# Patient Record
Sex: Female | Born: 1975 | Race: Black or African American | Hispanic: No | Marital: Married | Smoking: Never smoker
Health system: Southern US, Community
[De-identification: ages and names within clinical notes are randomized; demographics above are authoritative.]

## PROBLEM LIST (undated history)

## (undated) DIAGNOSIS — I10 Essential (primary) hypertension: Secondary | ICD-10-CM

## (undated) DIAGNOSIS — Z8759 Personal history of other complications of pregnancy, childbirth and the puerperium: Secondary | ICD-10-CM

## (undated) DIAGNOSIS — E785 Hyperlipidemia, unspecified: Secondary | ICD-10-CM

## (undated) HISTORY — DX: Essential (primary) hypertension: I10

## (undated) HISTORY — DX: Personal history of other complications of pregnancy, childbirth and the puerperium: Z87.59

---

## 1999-11-10 ENCOUNTER — Other Ambulatory Visit: Admission: RE | Admit: 1999-11-10 | Discharge: 1999-11-10 | Payer: Self-pay | Admitting: *Deleted

## 1999-11-10 ENCOUNTER — Other Ambulatory Visit: Admission: RE | Admit: 1999-11-10 | Discharge: 1999-11-10 | Payer: Self-pay | Admitting: Obstetrics & Gynecology

## 2000-02-22 ENCOUNTER — Inpatient Hospital Stay (HOSPITAL_COMMUNITY): Admission: AD | Admit: 2000-02-22 | Discharge: 2000-02-22 | Payer: Self-pay | Admitting: Obstetrics and Gynecology

## 2000-04-23 ENCOUNTER — Encounter (INDEPENDENT_AMBULATORY_CARE_PROVIDER_SITE_OTHER): Payer: Self-pay

## 2000-04-23 ENCOUNTER — Encounter: Payer: Self-pay | Admitting: Obstetrics and Gynecology

## 2000-04-23 ENCOUNTER — Inpatient Hospital Stay (HOSPITAL_COMMUNITY): Admission: AD | Admit: 2000-04-23 | Discharge: 2000-04-27 | Payer: Self-pay | Admitting: Obstetrics and Gynecology

## 2000-04-28 ENCOUNTER — Encounter: Admission: RE | Admit: 2000-04-28 | Discharge: 2000-05-28 | Payer: Self-pay | Admitting: Obstetrics and Gynecology

## 2000-06-28 ENCOUNTER — Encounter: Admission: RE | Admit: 2000-06-28 | Discharge: 2000-07-28 | Payer: Self-pay | Admitting: Obstetrics and Gynecology

## 2000-07-29 ENCOUNTER — Encounter: Admission: RE | Admit: 2000-07-29 | Discharge: 2000-08-28 | Payer: Self-pay | Admitting: Obstetrics and Gynecology

## 2000-09-28 ENCOUNTER — Encounter: Admission: RE | Admit: 2000-09-28 | Discharge: 2000-10-28 | Payer: Self-pay | Admitting: Obstetrics and Gynecology

## 2000-10-29 ENCOUNTER — Encounter: Admission: RE | Admit: 2000-10-29 | Discharge: 2000-11-28 | Payer: Self-pay | Admitting: Obstetrics and Gynecology

## 2000-12-29 ENCOUNTER — Encounter: Admission: RE | Admit: 2000-12-29 | Discharge: 2001-01-28 | Payer: Self-pay | Admitting: Obstetrics and Gynecology

## 2002-10-29 ENCOUNTER — Other Ambulatory Visit: Admission: RE | Admit: 2002-10-29 | Discharge: 2002-10-29 | Payer: Self-pay | Admitting: Obstetrics and Gynecology

## 2003-05-27 ENCOUNTER — Ambulatory Visit (HOSPITAL_COMMUNITY): Admission: RE | Admit: 2003-05-27 | Discharge: 2003-05-27 | Payer: Self-pay | Admitting: Obstetrics and Gynecology

## 2003-09-01 ENCOUNTER — Ambulatory Visit (HOSPITAL_COMMUNITY): Admission: RE | Admit: 2003-09-01 | Discharge: 2003-09-01 | Payer: Self-pay | Admitting: Obstetrics and Gynecology

## 2003-10-18 ENCOUNTER — Inpatient Hospital Stay (HOSPITAL_COMMUNITY): Admission: AD | Admit: 2003-10-18 | Discharge: 2003-10-18 | Payer: Self-pay | Admitting: Obstetrics and Gynecology

## 2003-10-26 ENCOUNTER — Inpatient Hospital Stay (HOSPITAL_COMMUNITY): Admission: AD | Admit: 2003-10-26 | Discharge: 2003-10-26 | Payer: Self-pay | Admitting: Obstetrics and Gynecology

## 2003-11-06 ENCOUNTER — Inpatient Hospital Stay (HOSPITAL_COMMUNITY): Admission: AD | Admit: 2003-11-06 | Discharge: 2003-11-06 | Payer: Self-pay | Admitting: Obstetrics and Gynecology

## 2003-11-09 ENCOUNTER — Inpatient Hospital Stay (HOSPITAL_COMMUNITY): Admission: AD | Admit: 2003-11-09 | Discharge: 2003-11-09 | Payer: Self-pay | Admitting: Obstetrics and Gynecology

## 2003-11-09 ENCOUNTER — Inpatient Hospital Stay (HOSPITAL_COMMUNITY): Admission: AD | Admit: 2003-11-09 | Discharge: 2003-11-26 | Payer: Self-pay | Admitting: Obstetrics and Gynecology

## 2003-11-23 ENCOUNTER — Encounter (INDEPENDENT_AMBULATORY_CARE_PROVIDER_SITE_OTHER): Payer: Self-pay | Admitting: *Deleted

## 2004-08-19 IMAGING — US US OB TRANSVAGINAL MODIFY
1 series · 14 of 25 positions shown · non-contrast
Comparison: none

CLINICAL DATA: 27-year-old.  G2 P1 with LMP of 03/31/03.  Evaluate for gestational age.

[Series 1: unknown · 0.30mm/px · 14 of 25 slices shown]
[im 1/25]
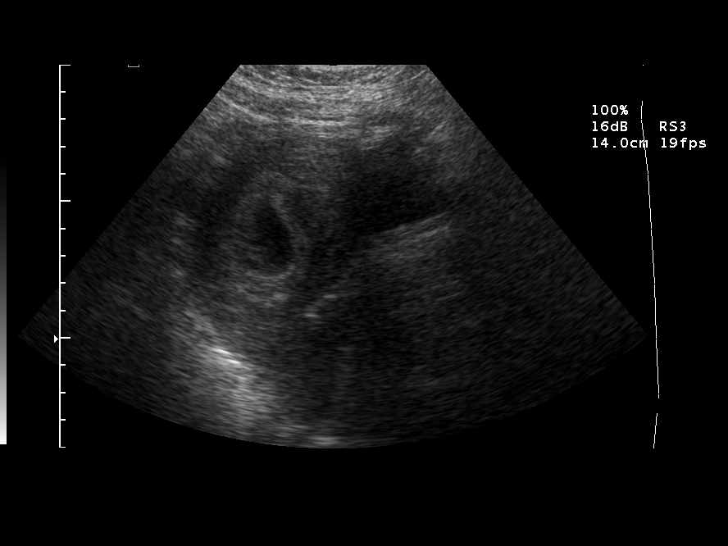
[im 3/25]
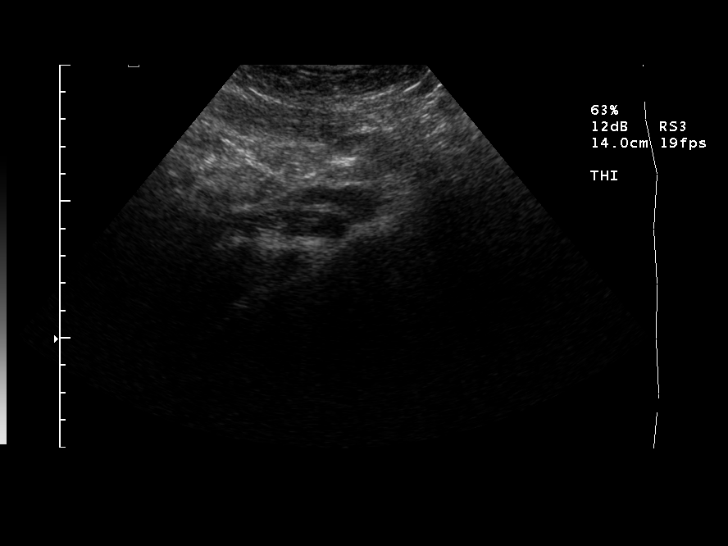
[im 5/25]
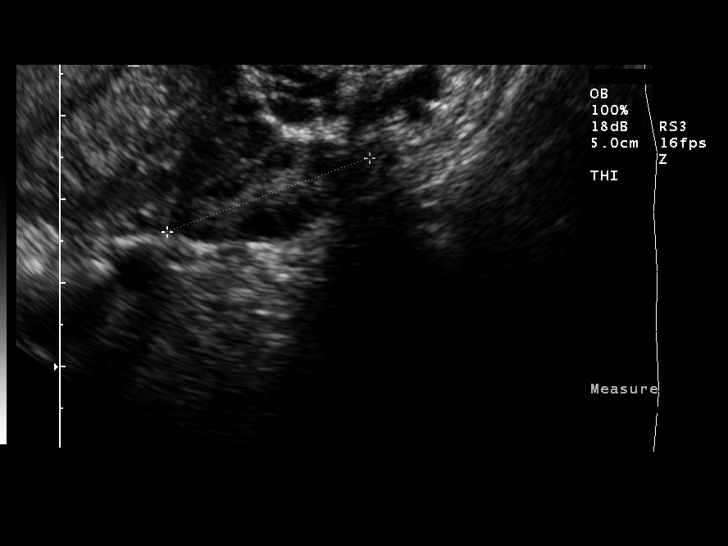
[im 7/25]
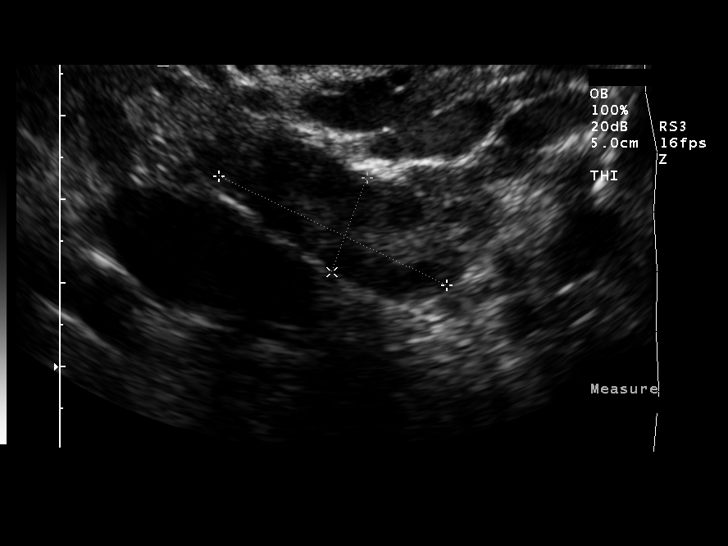
[im 9/25]
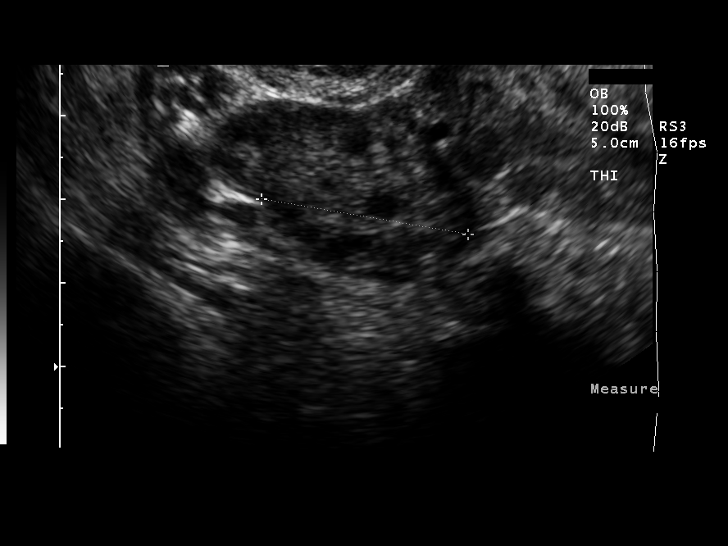
[im 10/25]
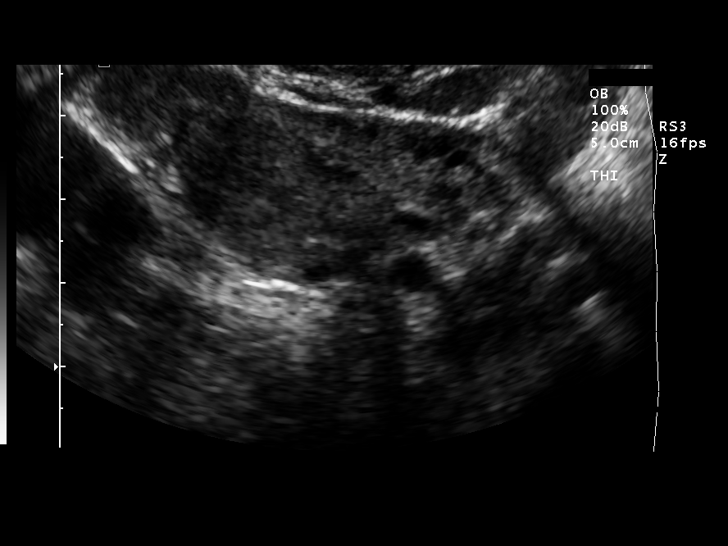
[im 12/25]
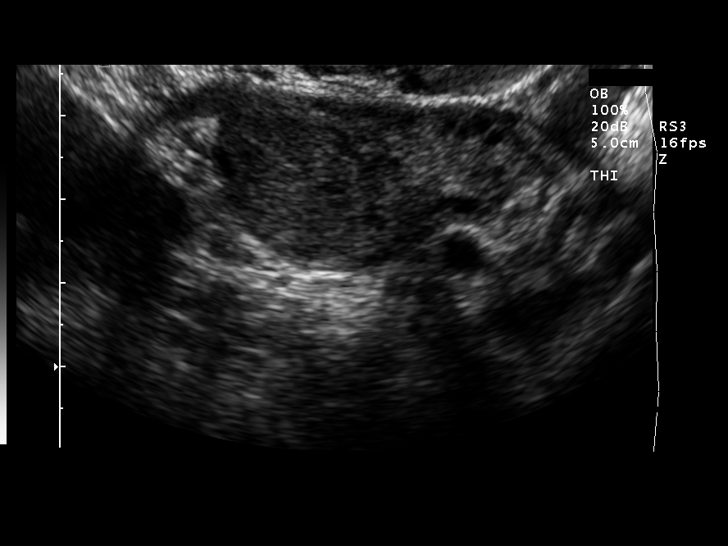
[im 14/25]
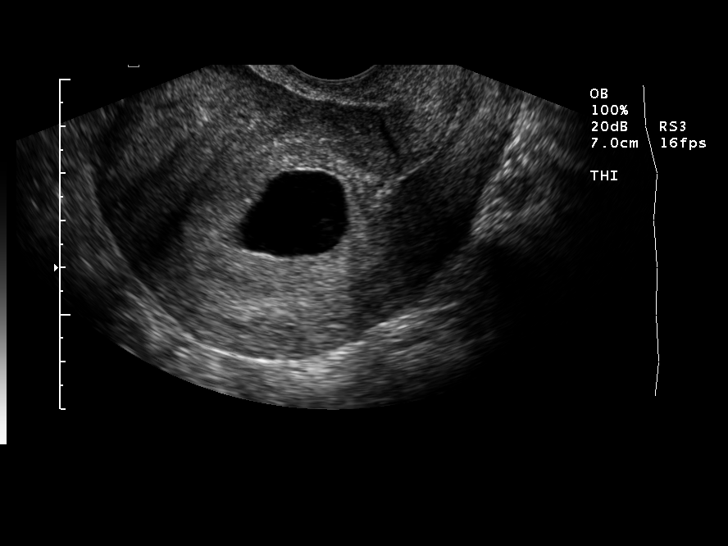
[im 16/25]
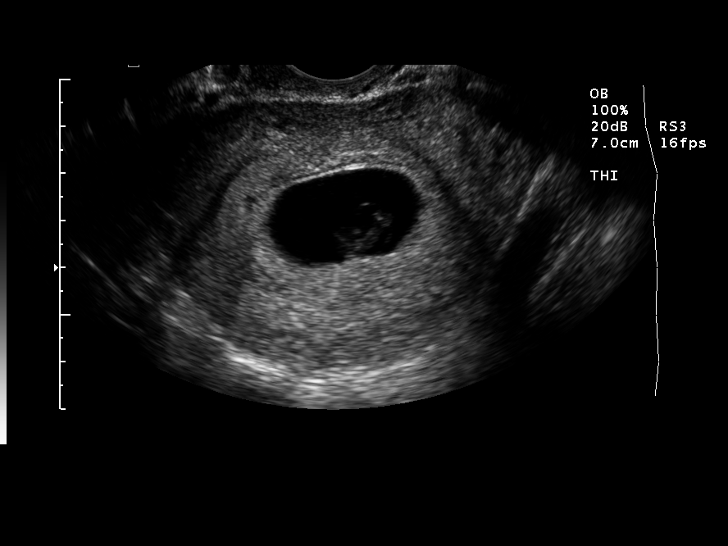
[im 17/25]
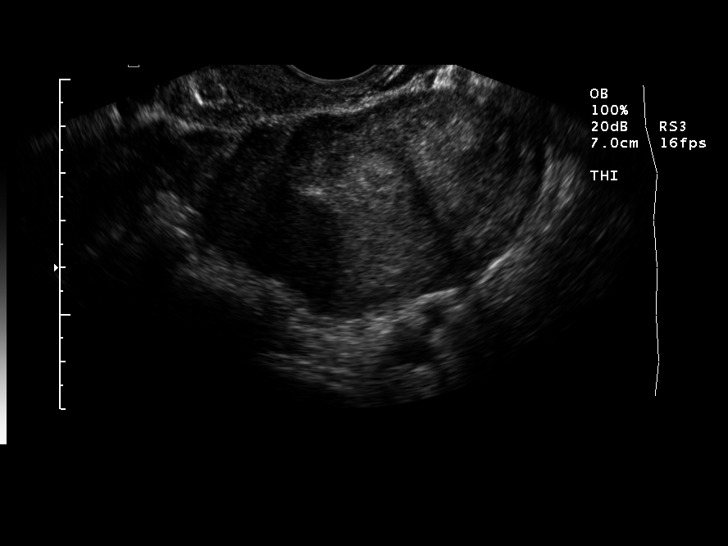
[im 19/25]
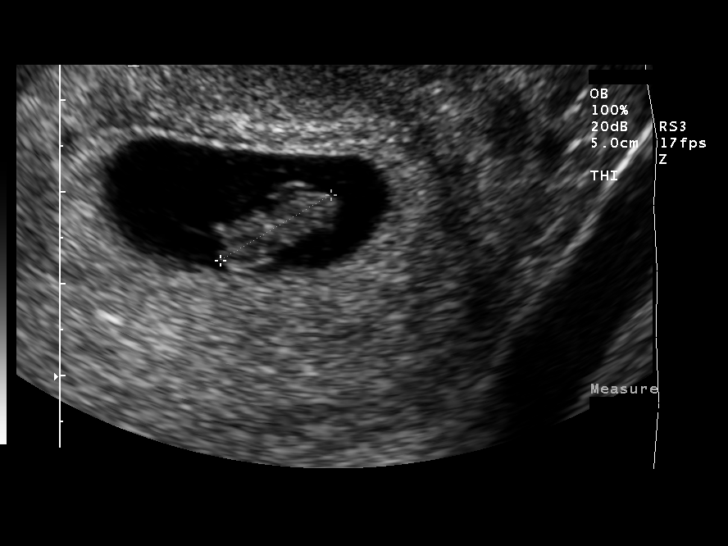
[im 21/25]
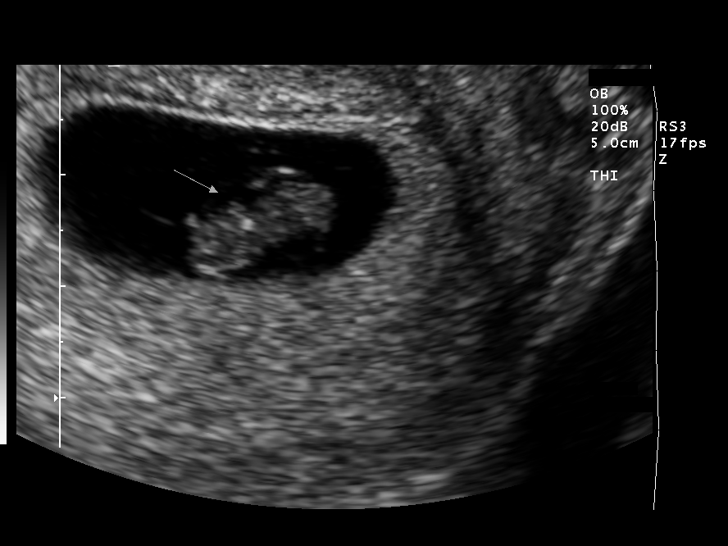
[im 23/25]
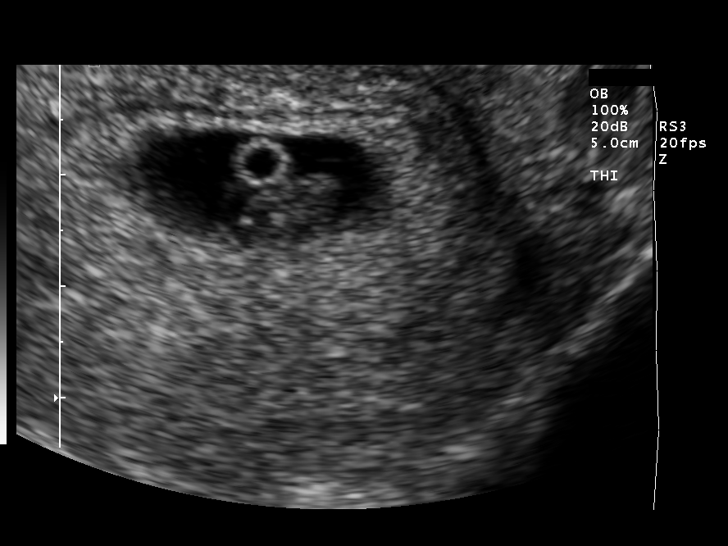
[im 25/25]
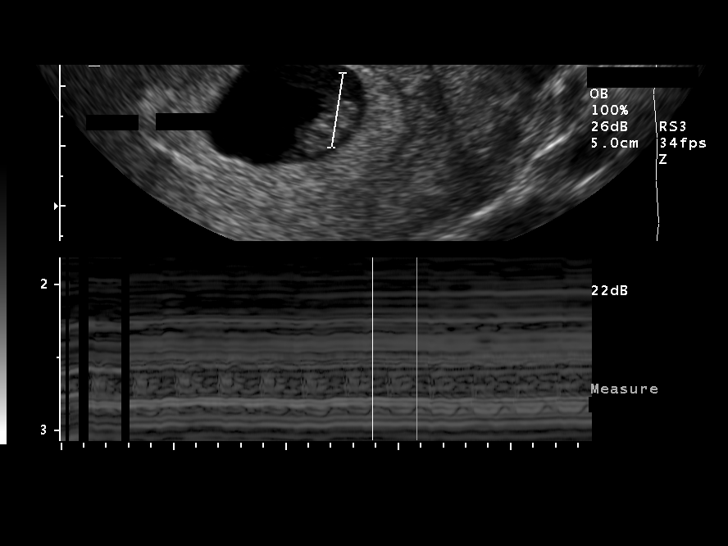

[14 of 25 positions shown; findings below may reference images not displayed]

OBSTETRICAL ULTRASOUND WITH TRANSVAGINAL

Number of Fetuses:  1
Heart Rate: 153
Presentation:  Variable 
Amniotic fluid:  Normal
CRL:   1.4 cm   7 w 5 d

Ultrasound EDC:  01/08/04

Fetal anatomy could not be evaluated due to the early gestational age.  Yolk sac and amnion are seen.

MATERNAL FINDINGS
Cervix not evaluated.  Right ovary has a corpus luteum cyst.  Left ovary is within normal limits.
IMPRESSION: Single living intrauterine fetus in variable presentation.  Size by ultrasound is 3 days behind the dating predicted by LMP.  No anomalies are seen.

## 2004-11-24 IMAGING — US US OB COMP +14 WK
1 series · 7 of 7 positions shown · non-contrast
Comparison: none

CLINICAL DATA: 22 week 0 day assigned gestational age by LMP and prior ultrasound.  Evaluate fetal anatomy and growth.  Patient was hypertensive on last exam.

[Series 1: unknown · 0.28mm/px · 7 of 7 slices shown]
[im 1/7]
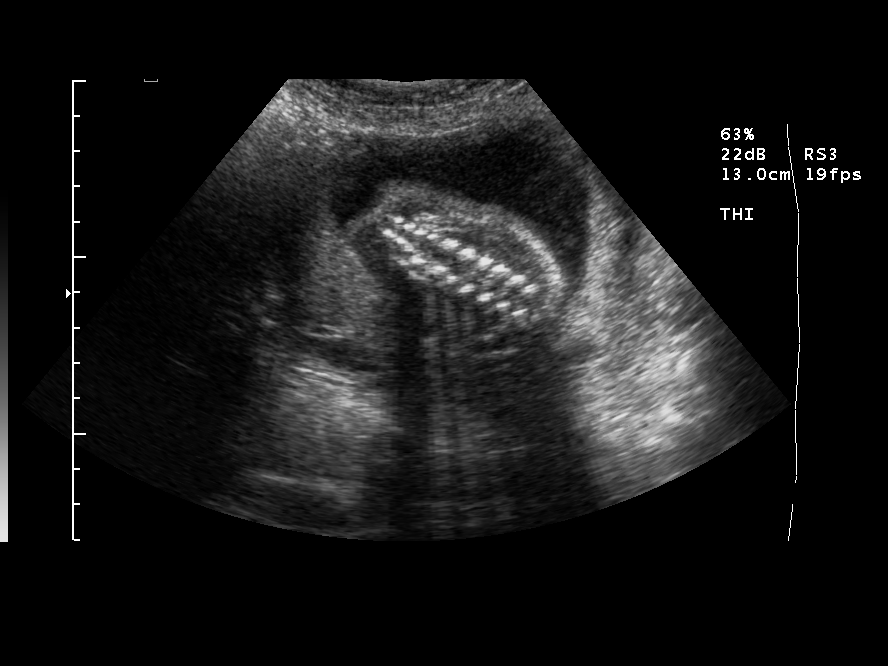
[im 2/7]
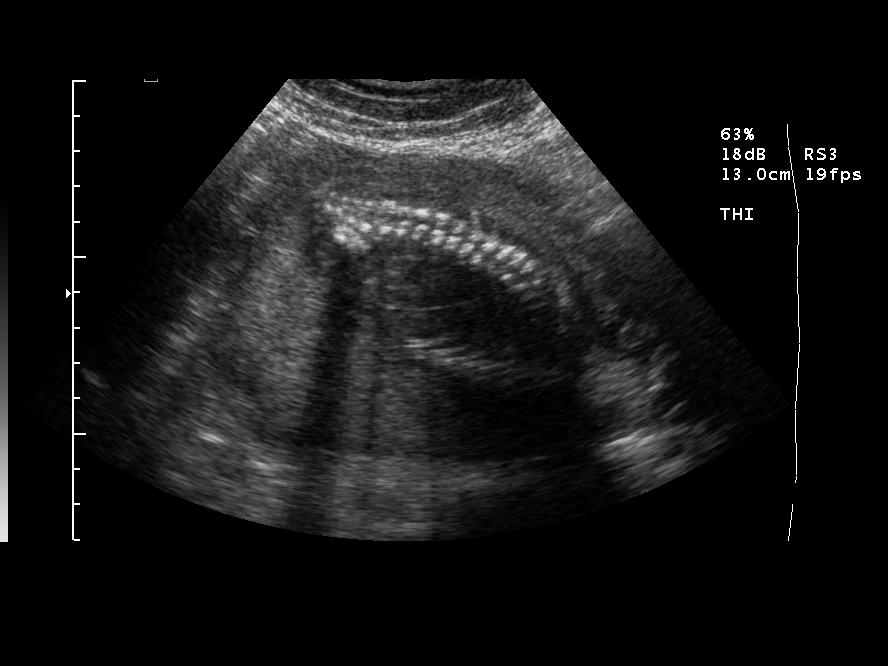
[im 3/7]
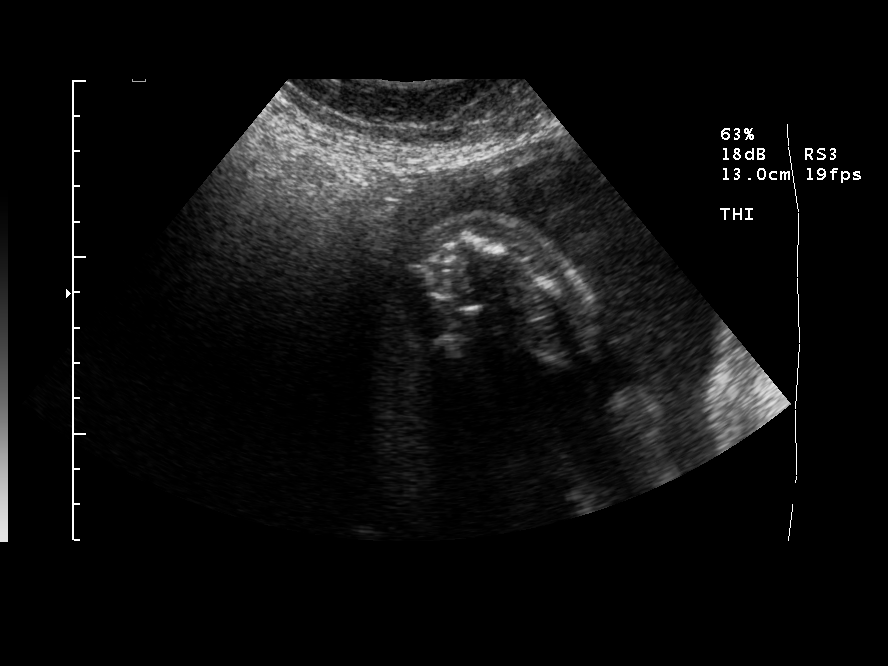
[im 4/7]
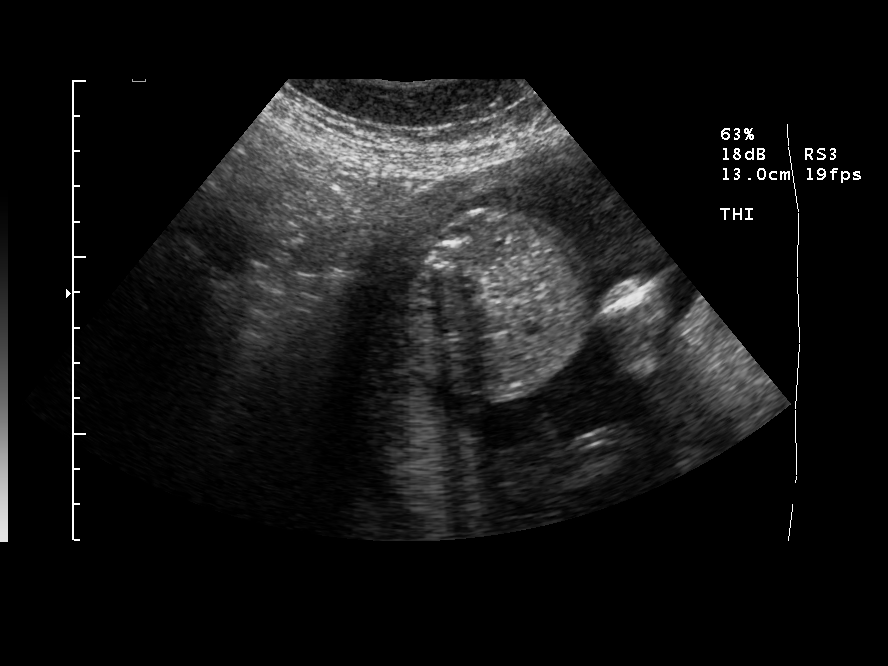
[im 5/7]
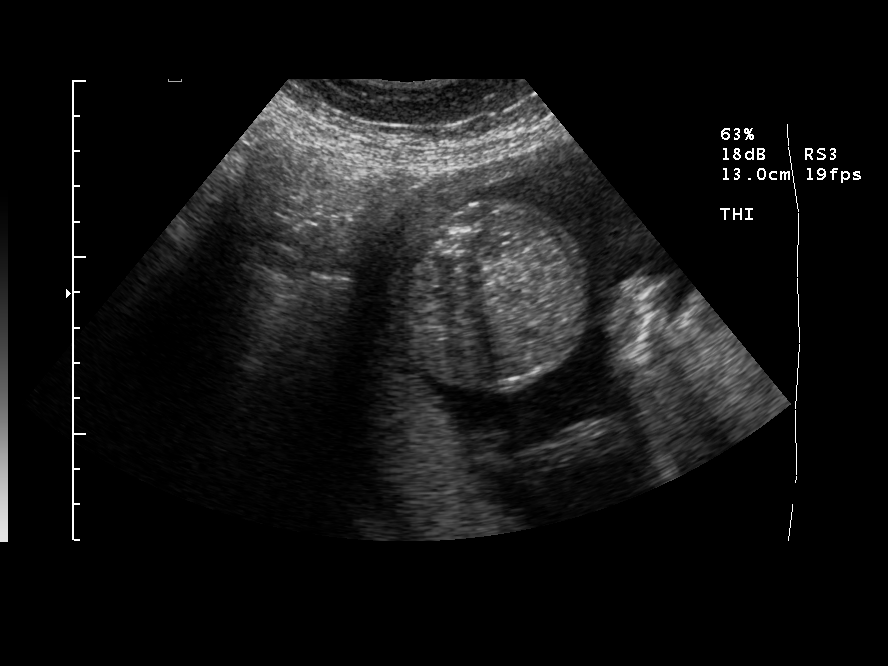
[im 6/7]
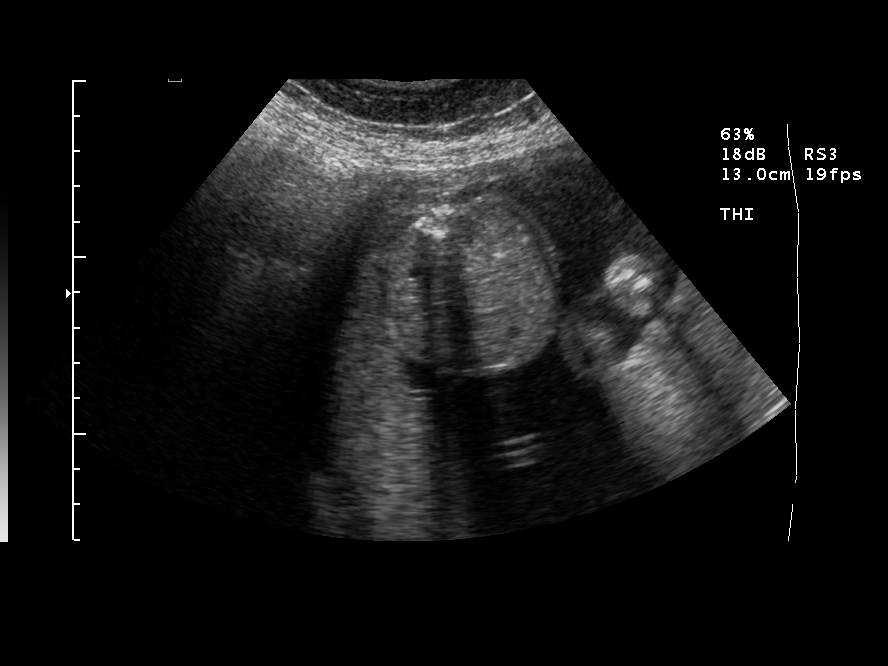
[im 7/7]
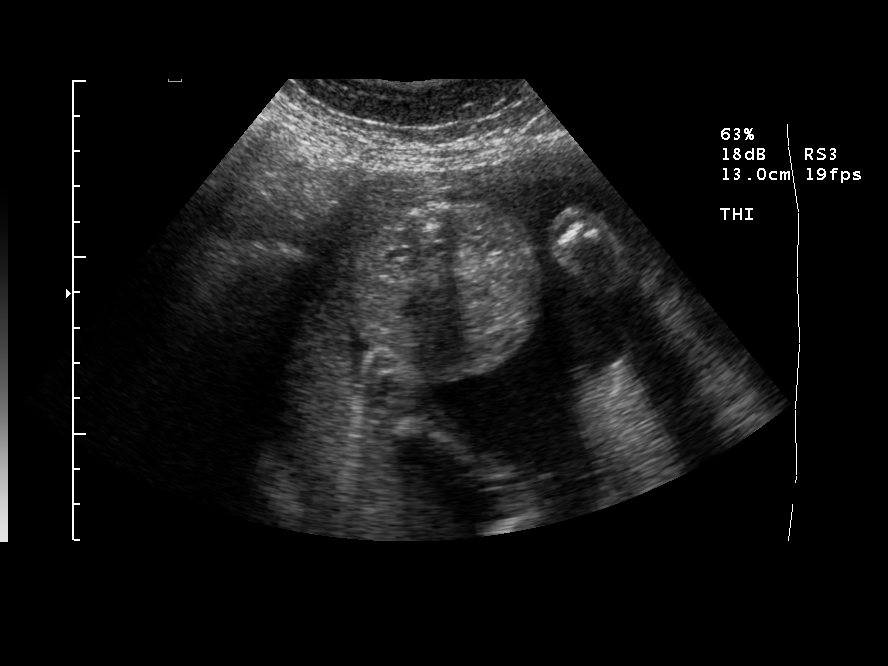

[7 of 7 positions shown; findings below may reference images not displayed]

OBSTETRICAL ULTRASOUND
 Number of Fetuses:  1
 Heart Rate:  153
 Movement:  Yes
 Breathing:  No  
 Presentation:  Cephalic
 Placental Location:  Posterior
 Grade:  I
 Previa:  No
 Amniotic Fluid (Subjective):  Normal
 Amniotic Fluid (Objective):   4.8 cm Vertical pocket 

 FETAL BIOMETRY
 BPD:   5.3 cm  22 w 0 d
 HC:   19.3 cm  21 w 4 d
 AC:   17.4 cm  22 w 1 d
 FL:    3.8 cm  22 w 1 d

 MEAN GA:  22 w 0 d

 FETAL ANATOMY
 Lateral Ventricles:    Visualized 
 Thalami/CSP:      Visualized 
 Posterior Fossa:  Visualized 
 Nuchal Region:    N/A
 Spine:      Visualized 
 4 Chamber Heart on Left:      Visualized 
 Stomach on Left:      Visualized 
 3 Vessel Cord:    Visualized 
 Cord Insertion site:    Visualized 
 Kidneys:  Visualized 
 Bladder:  Visualized 
 Extremities:      Visualized 

 ADDITIONAL ANATOMY VISUALIZED:  LVOT, RVOT, upper lip, orbits, profile, heel, 5th digit, ductal arch, aortic arch, and female genitalia

 MATERNAL FINDINGS
 Cervix:   3.0 cm Transabdominally 
 Both ovaries are visualized and are unremarkable.  No adnexal abnormalities are identified.
IMPRESSION: Assigned gestational age is currently 22 weeks 0 days.  Appropriate fetal growth.  
 No evidence of fetal anatomic abnormality.  

 </u12:p>

## 2005-02-01 IMAGING — US US FETAL BPP W/O NONSTRESS
1 series · 14 of 21 positions shown · non-contrast
Comparison: none

CLINICAL DATA: Decreased fetal movement.  Assess fetal well-being.

[Series 1: unknown · 0.26mm/px · 14 of 21 slices shown]
[im 1/21]
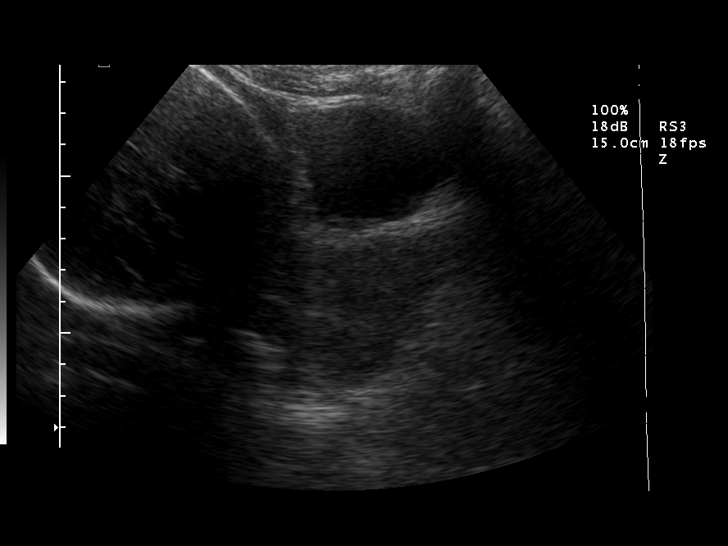
[im 3/21]
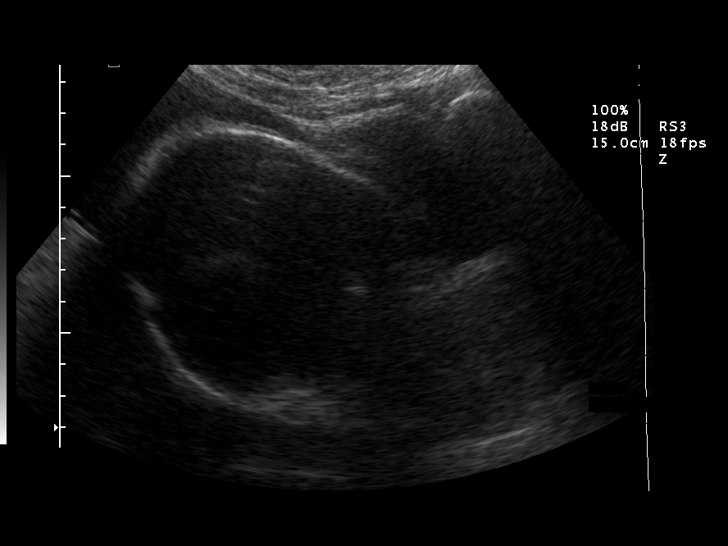
[im 4/21]
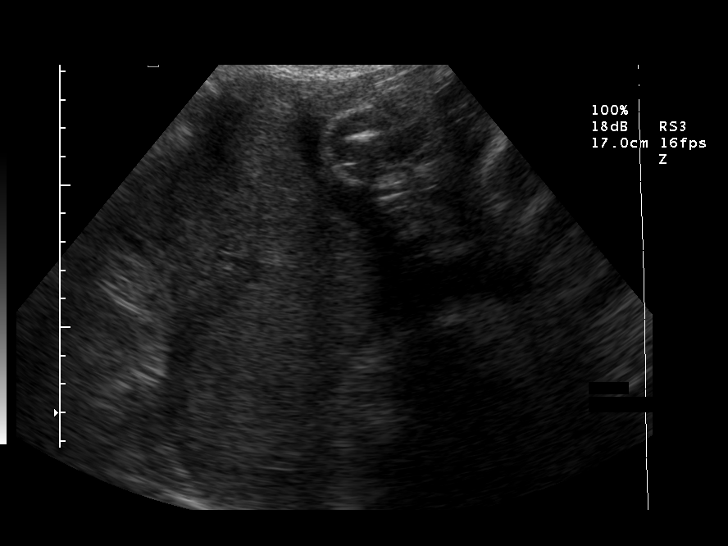
[im 6/21]
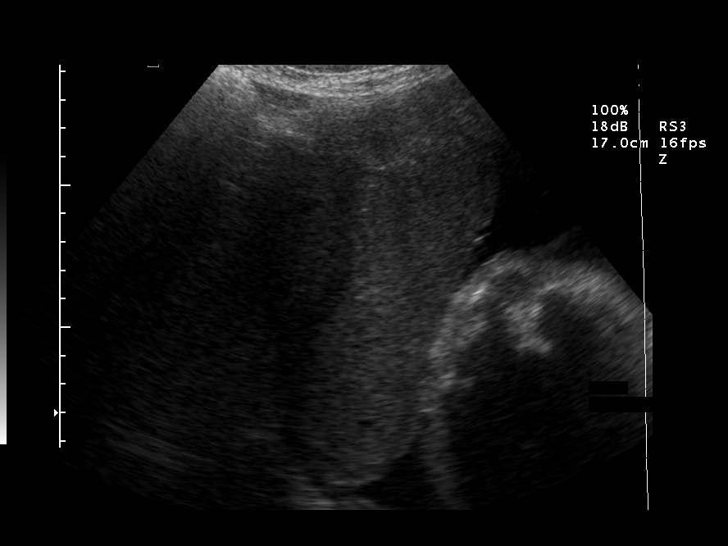
[im 7/21]
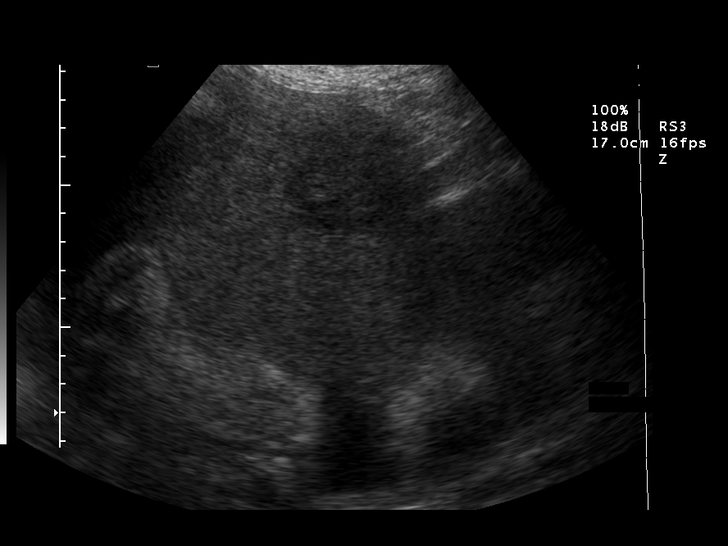
[im 9/21]
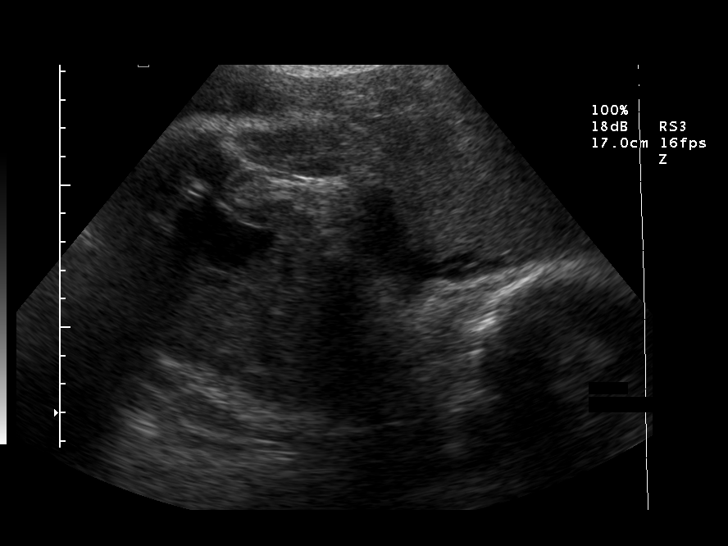
[im 10/21]
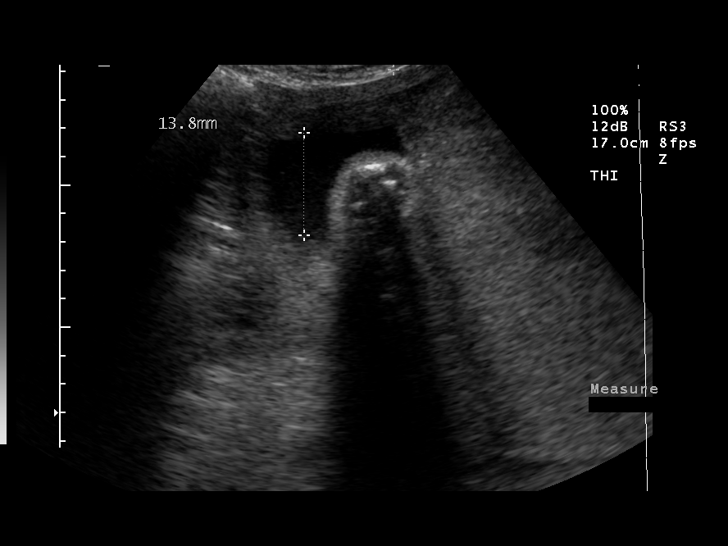
[im 12/21]
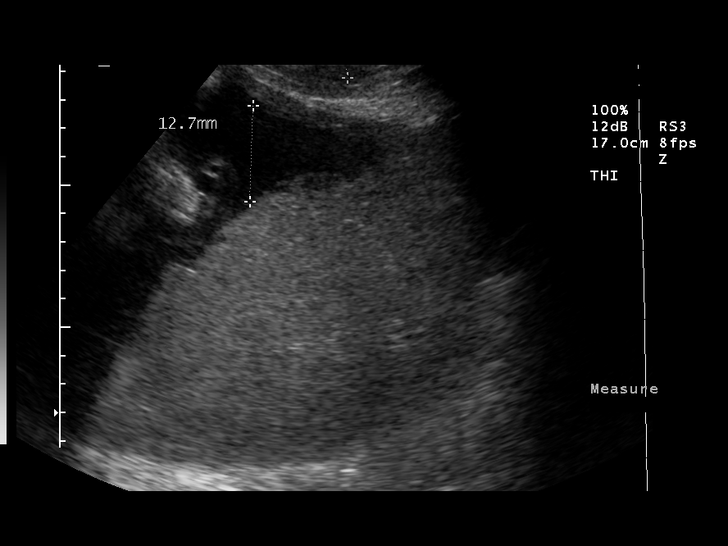
[im 13/21]
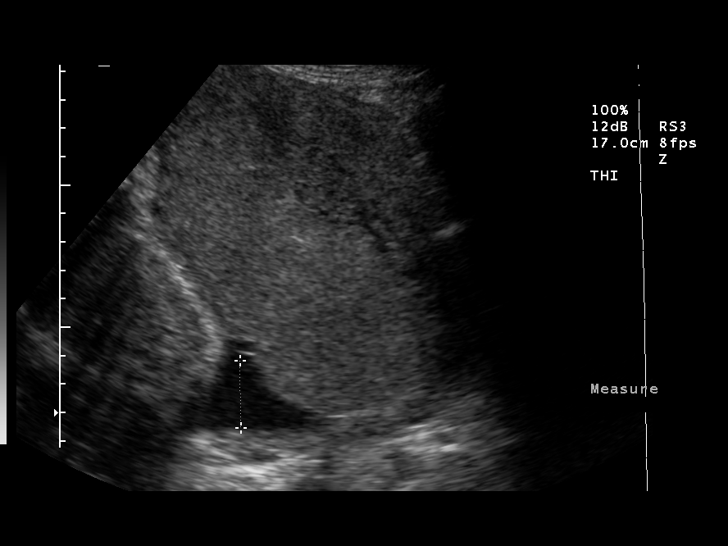
[im 15/21]
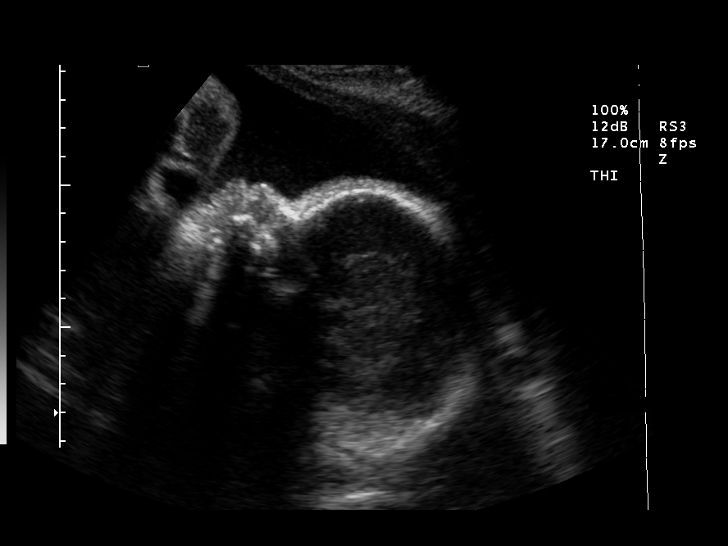
[im 16/21]
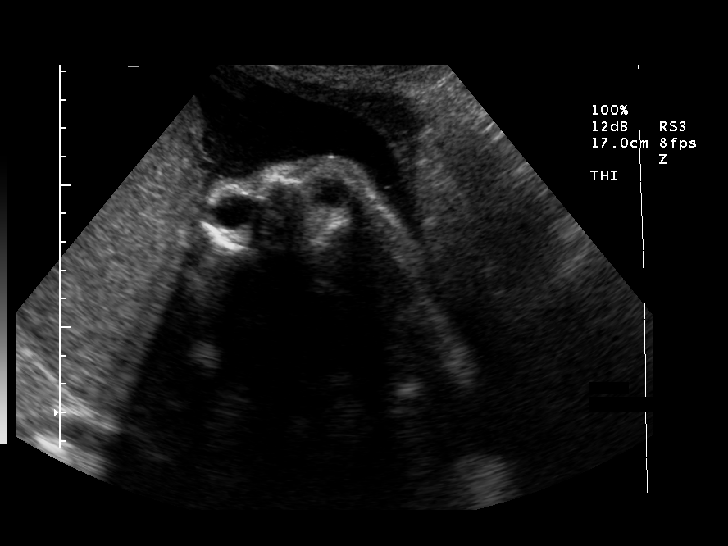
[im 18/21]
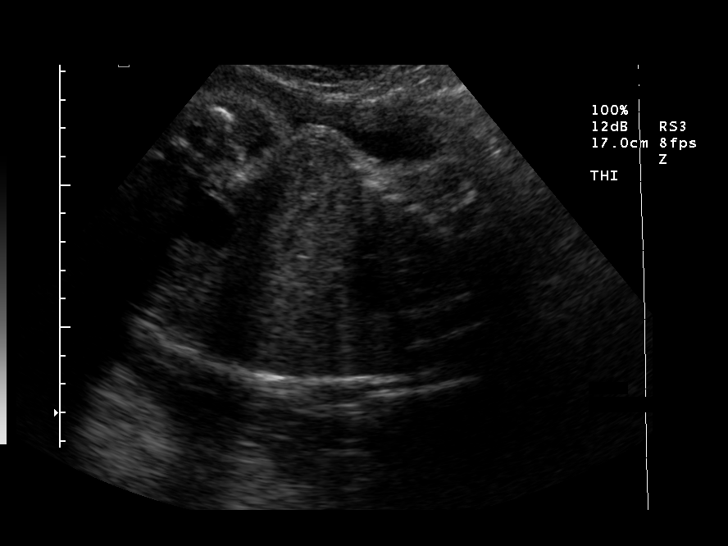
[im 19/21]
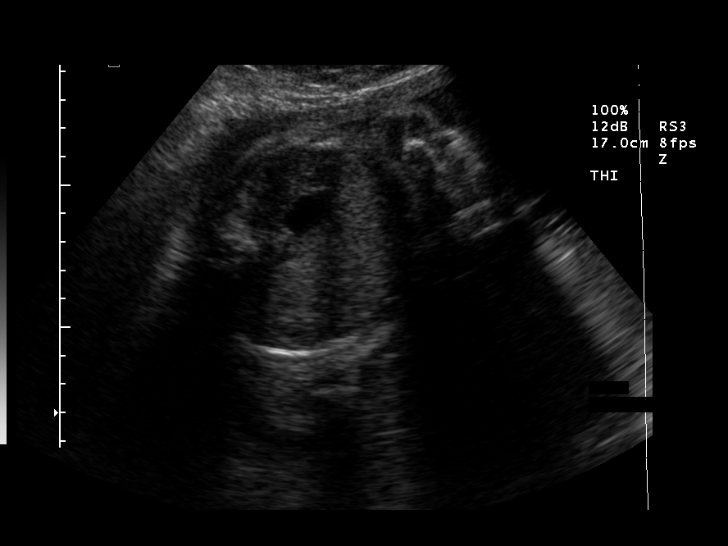
[im 21/21]
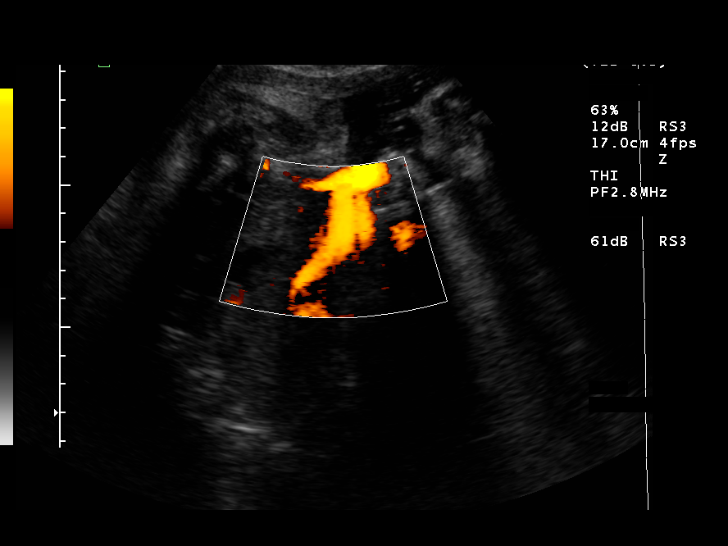

[14 of 21 positions shown; findings below may reference images not displayed]

BIOPHYSICAL PROFILE

Number of Fetuses: 1
Heart rate:  158
Movement:  Yes
Breathing:  Yes
Presentation:  Cephalic
Placental Location:  Posterior
Grade:  II
Previa:  No
Amniotic Fluid (Subjective):  Normal
Amniotic Fluid (Objective):  13.1 cm AFI (5th -95th%ile = 8.6 ? 24.2 cm for 32 wks)

Fetal measurements and complete anatomic evaluation were not requested.  The following fetal anatomy was visualized on this exam:  Stomach, 3-vessel cord, kidneys, bladder, upper lip, orbits, profile and diaphragm.  

BPP SCORING
Movements:  2  Time:  15 minutes
Breathing:  2
Tone:  2
Amniotic Fluid:  2
Total Score:  8

MATERNAL FINDINGS:
Cervix:  3.5 cm Transabdominally
IMPRESSION: Subjectively and quantitatively normal amniotic fluid volume and normal cervical length.  
Biophysical profile score [DATE].
No late developing fetal anatomic abnormalities are identified associated with the stomach, kidneys or bladder.  A four chamber heart view and lateral ventricles could not be reassessed due to positioning on today?s exam.

## 2005-02-06 IMAGING — US US OB FOLLOW-UP
1 series · 13 of 28 positions shown · non-contrast
Comparison: none

CLINICAL DATA: 29-year-old with LMP of 03/31/03 making the [REDACTED] weeks and 3 days.  PIH.

[Series 1: us ob follow-up · 0.39mm/px · 13 of 52 slices shown]
[im 2/52]
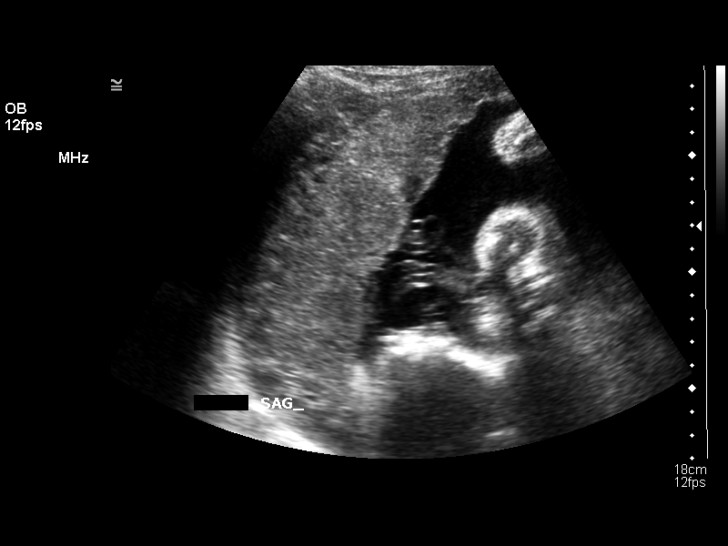
[im 6/52]
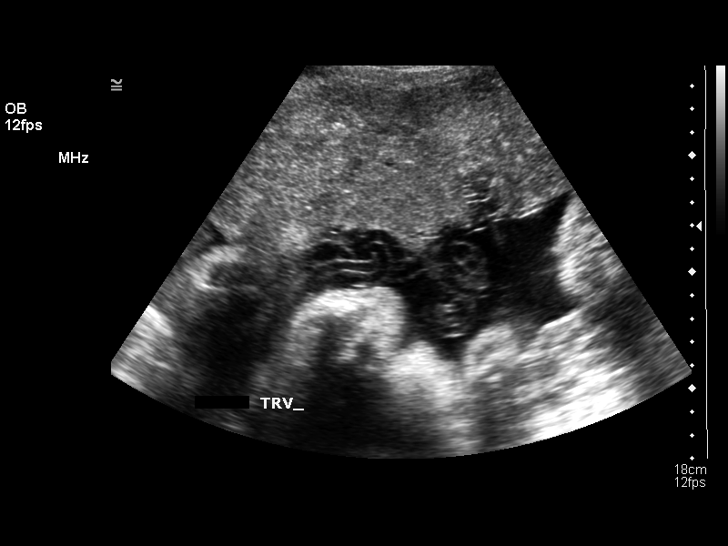
[im 10/52]
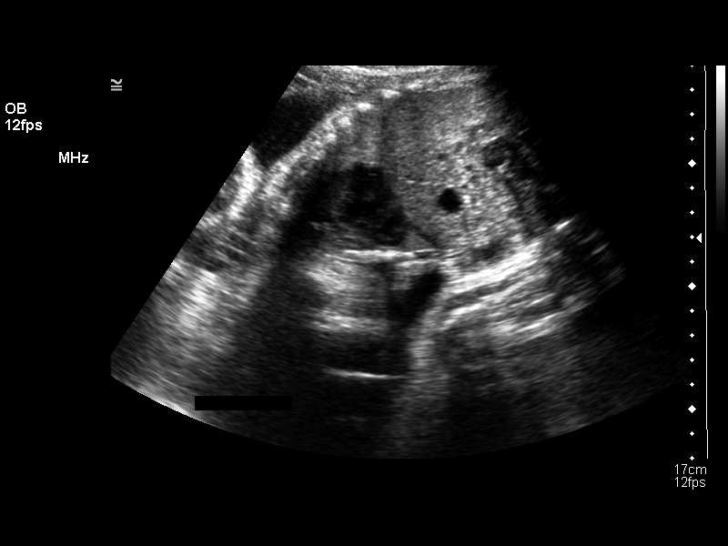
[im 14/52]
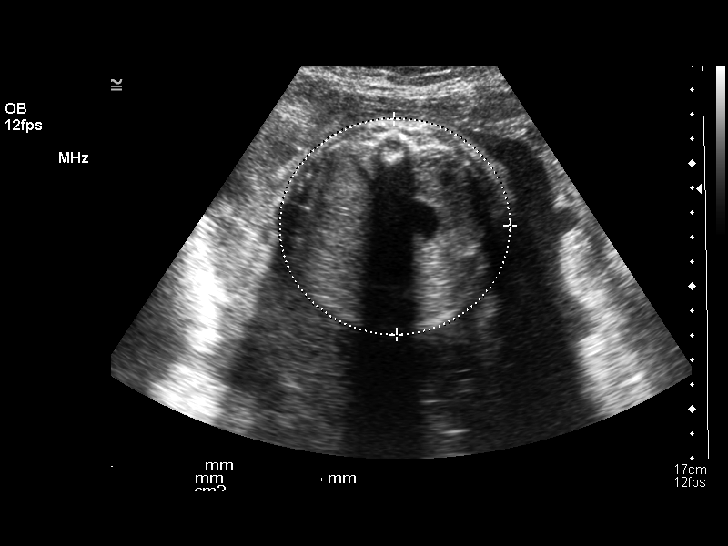
[im 18/52]
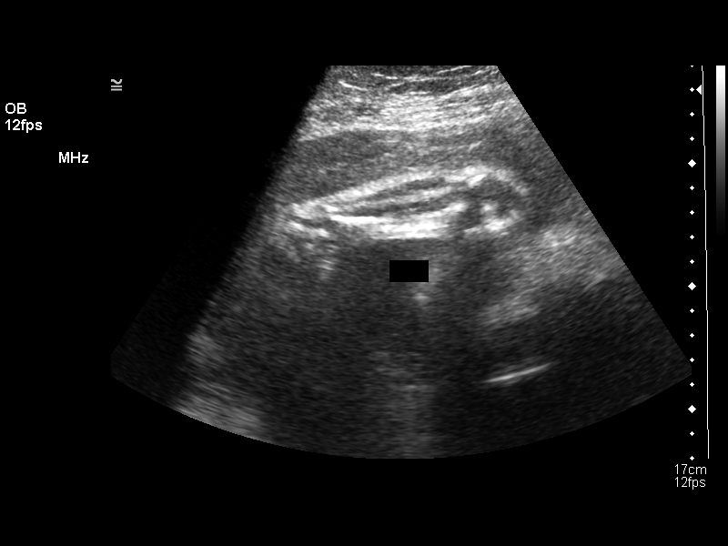
[im 21/52]
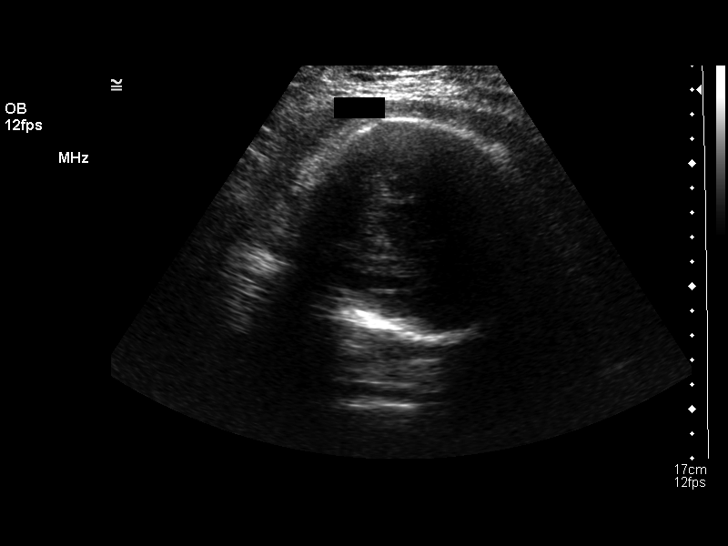
[im 27/52]
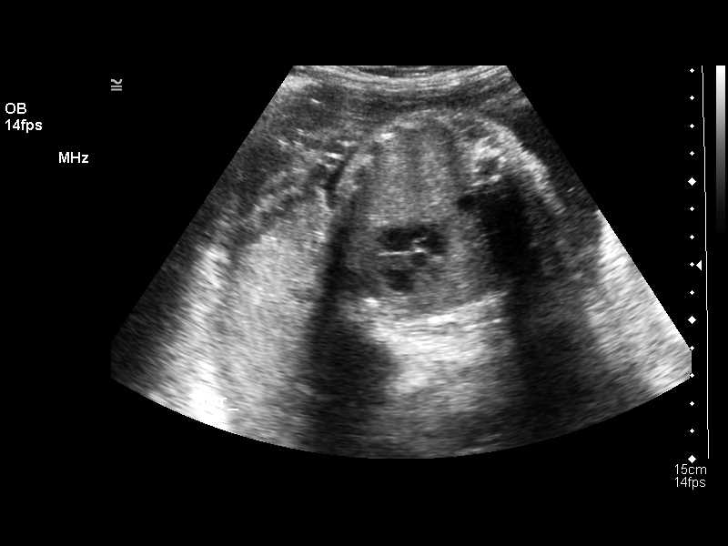
[im 31/52]
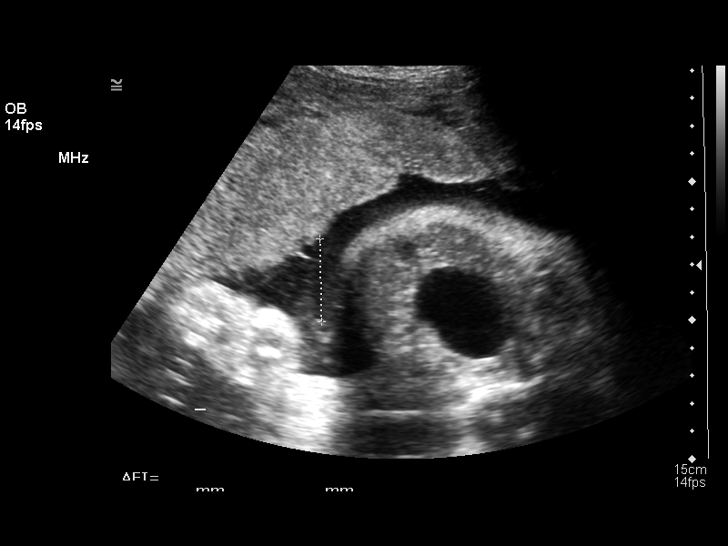
[im 35/52]
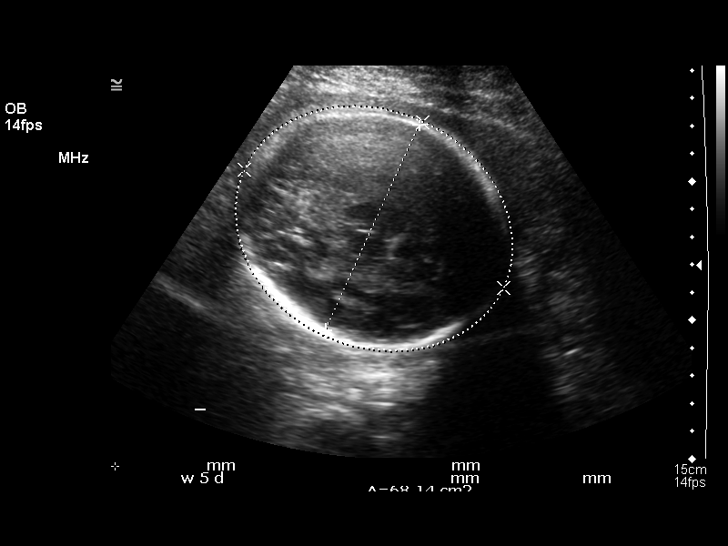
[im 38/52]
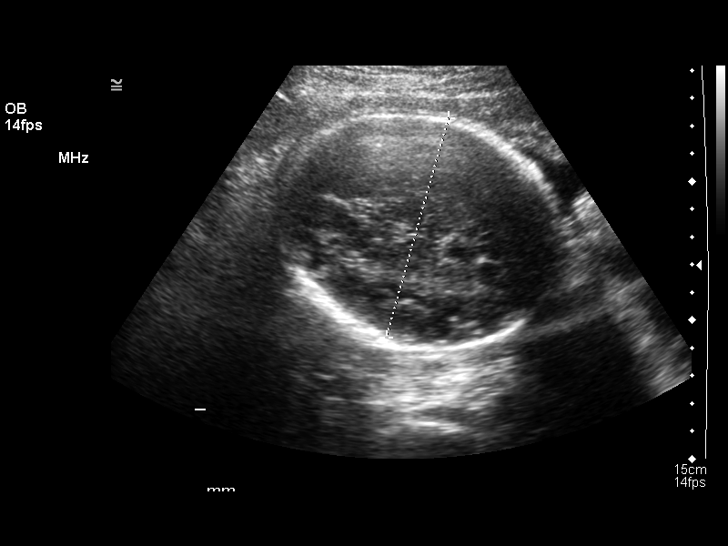
[im 42/52]
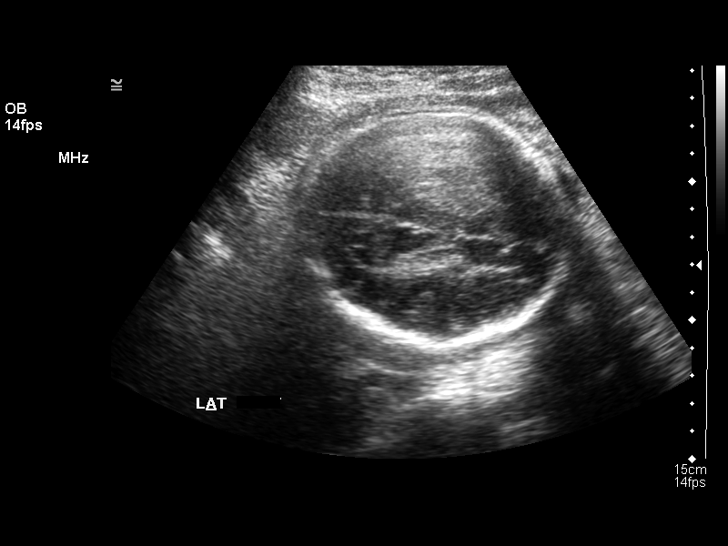
[im 46/52]
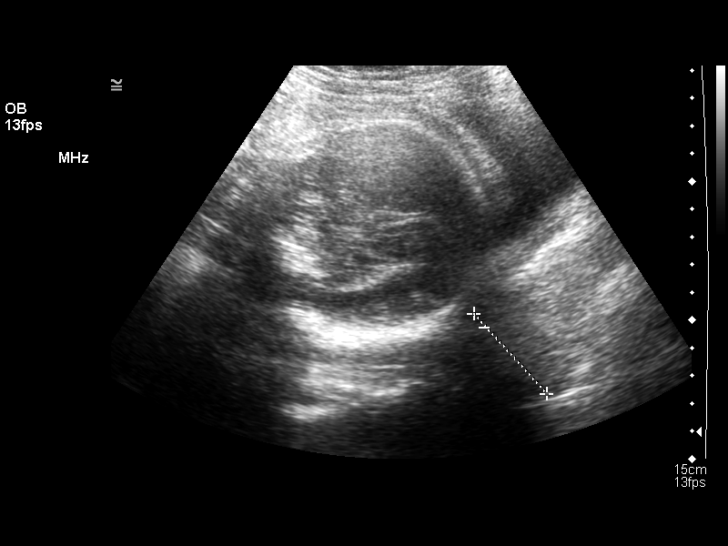
[im 50/52]
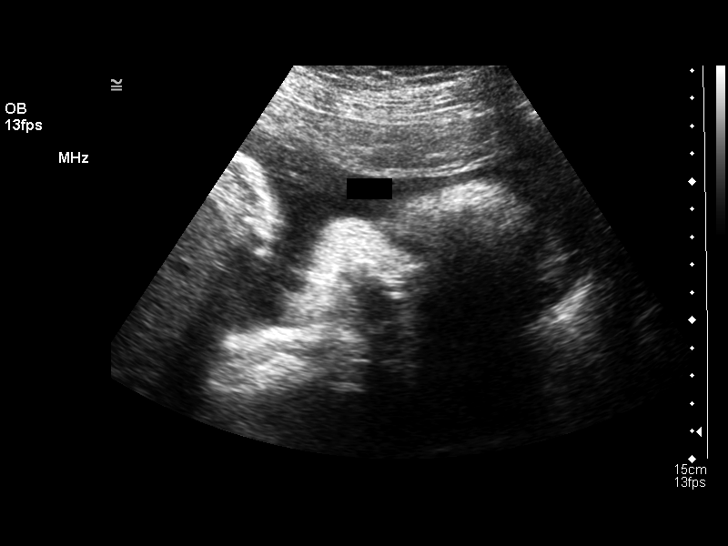

[13 of 28 positions shown; findings below may reference images not displayed]

OBSTETRICAL ULTRASOUND RE-EVALUATION
 Number of Fetuses:  1
 Heart Rate:  160
 Movement:  Yes
 Breathing:  Yes
 Presentation:  Cephalic 
 Placental Location:  Posterior
 Grade:  II
 Previa:  No
 Amniotic Fluid (subjective):  Normal
 Amniotic Fluid (objective):  18.0 cm AFI (5th -95th%ile = 8.6 – 24.2 cm for 32 wks)

 FETAL BIOMETRY
 BPD:    8.2 cm  33 w 0 d
 HC:  29.7 cm   33 w 0 d
 AC:  28.2 cm   32 w 2 d
 FL:  6.1 cm   32 w 0 d

 Mean GA:  32 w 4 d

 EFW:  4699 g (H) 75th – 90th%ile (1644 – 5846 g) For 32 wks

 FETAL ANATOMY
 Lateral Ventricles:  Visualized 
 Thalami/CSP:  Visualized 
 Posterior Fossa:  Previously seen 
 Nuchal Region:  N/A
 Spine:  Previously seen 
 4 Chamber Heart on Left:  Visualized 
 Stomach on Left:  Visualized 
 3 Vessel Cord:  Visualized 
 Cord Insertion Site:  Previously seen 
 Kidneys:  Visualized 
 Bladder:  Visualized 
 Extremities:  Previously seen 

 ADDITIONAL ANATOMY VISUALIZED:  LVOT, diaphragm and female genitalia

 Evaluation limited by:  Maternal habitus and advanced gestational age 

 MATERNAL FINDINGS
 Cervix:  4.0 cm Transabdominally
IMPRESSION: Single living intrauterine fetus in cephalic presentation. Amniotic fluid volume is within normal limits.  There has been appropriate interval growth.

## 2005-10-13 ENCOUNTER — Emergency Department (HOSPITAL_COMMUNITY): Admission: EM | Admit: 2005-10-13 | Discharge: 2005-10-13 | Payer: Self-pay | Admitting: Emergency Medicine

## 2006-06-19 ENCOUNTER — Emergency Department (HOSPITAL_COMMUNITY): Admission: EM | Admit: 2006-06-19 | Discharge: 2006-06-19 | Payer: Self-pay | Admitting: Family Medicine

## 2008-05-19 ENCOUNTER — Emergency Department (HOSPITAL_COMMUNITY): Admission: EM | Admit: 2008-05-19 | Discharge: 2008-05-19 | Payer: Self-pay | Admitting: Family Medicine

## 2010-07-15 NOTE — H&P (Signed)
Donna Atkinson, PARLOW                         ACCOUNT NO.:  [id number]   MEDICAL RECORD NO.:  [id number]                   PATIENT TYPE:  INP   LOCATION:  9162                                 FACILITY:  [facility]   PHYSICIAN:  Osborn Coho, M.D.                DATE OF BIRTH:  [birth year]   DATE OF ADMISSION:  11/09/2003  DATE OF DISCHARGE:                                HISTORY & PHYSICAL   HISTORY OF PRESENT ILLNESS:  Donna Atkinson is a 35 year old married female  gravida 2, para 0-1-0-1 at 31-6/7 weeks who presents for admission with  questionable chronic hypertension and superimposed mild preeclampsia.  She  completed a 24-hour urine today resulting in 560 g of protein, her PIH labs  were normal other than uric acid at 5 and hemoconcentration shown by  hematocrit at 36.2.  Her pregnancy has been followed by the [facility] MD service and has been remarkable for (1) history of oligohydramnios  and IUGR, (2) Rh negative, (3) previous cesarean section due to the  oligohydramnios, IUGR and nonreassuring fetal heart rate, this was done  preterm and patient wants a repeat cesarean section, (4) abnormal last  menstrual period, (5) thrombocytopenia, (6) desires bilateral tubal  ligation, (7) group B strep was negative on October 26, 2003.  The patient is  taking Aldomet 500 mg b.i.d. for her hypertension.   PRENATAL LABORATORIES:  Her prenatal labs were collected on May 25, 2003.  Hemoglobin 12.5, hematocrit 37.4, platelets 169,000, blood type O negative,  antibody negative, sickle cell trait negative, RPR nonreactive, rubella  immune, hepatitis B surface antigen negative, Pap smear was within normal  limits, gonorrhea negative, Chlamydia negative, cystic fibrosis negative.  Her 1-hour glucola on October 16, 2003 was within normal limits and her  hemoglobin at that time was 11.3.  On September 25, 2003 her hemoglobin was 11  and platelets were 128,000.   HISTORY OF PRESENT PREGNANCY:   Patient presented for care at [facility] on May 25, 2003 at 7-6/[redacted] weeks gestation.  Patient expressed a  desire at that time for a repeat cesarean section as a delivery method.  Patient declined the quad screen.  Pregnancy ultrasonography at [redacted] weeks  gestation showed growth consistent with previous dating.  Cervix 4.7 cm in  length.  Fluid within normal limits.  Repeat ultrasound for anatomy was  scheduled at [facility] secondary to incomplete scan that was limited  by patient body habitus.  Patient was evaluated in the office at [redacted] weeks  gestation and was treated for a urinary tract infection, at that visit her  blood pressure was 160/90 and 160/80 after lying down and it was suspected  that she had chronic hypertension.  At 22 weeks her anatomy scan was  complete and growth continued to be consistent with previous dating.  Due to  patient's elevated blood pressure the plan was made for her  to have a 24-  hour urine, to start on Aldomet 250 mg b.i.d. and as well as to have  ultrasound for growth at 28 weeks and a nonstress test twice a week  beginning at 32 weeks.  This plan was put in place at [redacted] weeks gestation.  Patient was in the office to discuss this plan with Dr. Su Hilt and all of  her questions were answered.  Her initial PIH lab work was normal other than  platelets at 127,000, her 24-hour urine had 240 mg of protein, patient's  blood pressure remained stable on the Aldomet running in the 130s/80s.  A  repeat cesarean section with bilateral tubal ligation was planned for [redacted]  weeks gestation.  Pregnancy ultrasonography at 28-3/7 weeks showed normal  AFI with appropriate growth at the 83rd percentile.  Patient continued on  Aldomet 250 mg b.i.d.   OBSTETRICAL HISTORY:  She is a gravida 2, para 0-1-0-1.  In February 2002  she had a C-section for a female infant weighing 3 pounds 9 ounces at [redacted] weeks  gestation.  Infant's name was Donna Atkinson and the reason for the cesarean  section  was oligohydramnios as well as intrauterine growth restriction with abnormal  Dopplers.   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  Patient  had menarche at the age of 29 with 28-day interval, menses lasting 3-4 days.  She has an occasional yeast infection.  She has used withdraw as her method  of contraception.  She reports having had the usual childhood illnesses.  She has been treated for a urinary tract infection once in the past.   SURGICAL HISTORY:  Wisdom teeth extraction as a teenager and primary  cesarean section in 2002.   FAMILY MEDICAL HISTORY:  Father deceased from MI.  Multiple family members  with hypertension.  Mother with varicosities.  Father with kidney failure.  Maternal aunt with breast cancer.   GENETIC HISTORY:  Genetic history is remarkable for autism in patient's  nephew as well as first cousins and second cousins.   SOCIAL HISTORY:  The patient is married to the father of the baby.  His name  is Merchandiser, retail.  Patient has 16 years of education and has been employed full  time at [employer].  Father of the baby has 12 years of education  and is employed at [employer].  They are of the  [facility].  They deny any alcohol, tobacco, or illicit drug use with the  pregnancy.   OBJECTIVE DATA:  Blood pressures have been well controlled with Aldomet.  Other vital signs are stable.  Fetal heart rate was reassuring when she was  in maternity admissions earlier today, there were no uterine contractions,  pelvic exam was deferred.  Chest is clear to auscultation.  Heart is regular  rate and rhythm.  Extremities are within normal limits.   ASSESSMENT:  1.  Intrauterine pregnancy at 31-6/7 weeks.  2.  Questionable chronic hypertension with mild superimposed preeclampsia.   PLAN:  1.  Admit to birthing suites per Dr. Su Hilt. 2.  Begin magnesium sulfate therapy.  3.  Give patient betamethasone  series.  4.  Plan complete OB ultrasound in the morning.     Cam Hai, C.N.M.                     Osborn Coho, M.D.    KS/MEDQ  D:  11/09/2003  T:  11/09/2003  Job:  [id number]

## 2010-07-15 NOTE — H&P (Signed)
[facility]  Patient:    Donna Atkinson, Donna Atkinson                      MRN: [id number] Adm. Date:  [id number] Attending:  Leonard Schwartz Dictator:   Vance Gather Duplantis, C.N.M.                         History and Physical  HISTORY OF PRESENT ILLNESS:   Ms. Sharples is a 35 year old married black female, gravida 1, para 0, at 35-5/7 weeks who presents for admission and delivery secondary to significant IUGR, less than 10th percentile, and oligohydramnios.  She has had a subsequent CST that had one decelerations and otherwise has actually had a reactive-looking nonstress test; she, however, has had Doppler flow studies that were significantly abnormal and she is being admitted for cesarean section delivery.  She denies any nausea, vomiting, headaches or visual disturbances.  She reports that her last meal was at noon today.  Her pregnancy has been followed at [facility] by the certified nurse midwife service until this date and has been essentially uncomplicated until this date.  GYNECOLOGICAL HISTORY:        She is a primigravida with an LMP of August 17, 1999, giving her an [facility] of May 23, 2000.  She has had no other GYN problems.  ALLERGIES:                    She is allergic to [facility], it gives her anaphylaxis.  GENERAL MEDICAL HISTORY:      She reports having had the usual childhood diseases.  She reports occasional urinary tract infections and her only surgeries include wisdom teeth at age 14 and periodontal surgery as well.  FAMILY HISTORY:               Her family history is significant for father with MI; father, sister and paternal grandmother with hypertension, on medications; maternal grandmother with varicosities; father with diabetes, on oral medication; father with kidney failure requiring dialysis; maternal aunt with breast cancer.  GENETIC HISTORY:              Genetic history is essentially negative though patient has  nephew and first cousin with autism.  SOCIAL HISTORY:               She is married to [employer] who is involved and supportive.  They are both employed full-time.  They are of the [facility] faith.  They deny any illicit drug use, alcohol or smoking with this pregnancy.  PRENATAL LABORATORY DATA:     Her blood type is O-negative.  Her antibody screen is negative.  Syphilis is nonreactive.  GC and Chlamydia are both negative.  Sickle cell trait is negative.  Rubella is immune.  Hepatitis B surface antigen is negative.  HIV is nonreactive.  Pap was within normal limits.  Her one-hour glucola was also within normal limits and her group B strep was only collected today.  PHYSICAL EXAMINATION  VITAL SIGNS:                  Her vital signs are stable.  She is afebrile.  HEENT:                        Grossly within normal limits.  HEART:  Regular rhythm and rate.  CHEST:                        Clear.  BREASTS:                      Soft and nontender.  ABDOMEN:                      Gravid with uterine contractions every three to six minutes.  Her fetal heart rate is overall reassuring.  PELVIC:                       Deferred.  EXTREMITIES:                  Within normal limits.  ASSESSMENT:                   1. Intrauterine pregnancy at 35-5/7 weeks.                               2. Intrauterine growth restriction.                               3. Oligohydramnios.                               4. Abnormal Doppler flow studies.  PLAN:                         Her plan per Dr. Janine Limbo is to admit for cesarean section delivery. DD:  04/23/00 TD:  04/23/00 Job: [id number]

## 2010-07-15 NOTE — Discharge Summary (Signed)
Donna Atkinson, Donna Atkinson             ACCOUNT NO.:  [id number]   MEDICAL RECORD NO.:  [id number]          PATIENT TYPE:  INP   LOCATION:  9110                          FACILITY:  [facility]   PHYSICIAN:  Osborn Coho, M.D.   DATE OF BIRTH:  03/13/1975   DATE OF ADMISSION:  11/09/2003  DATE OF DISCHARGE:                                 DISCHARGE SUMMARY   ADMISSION DIAGNOSES:  1.  Intrauterine pregnancy at 31 and six-sevenths weeks.  2.  Chronic hypertension with superimposed preeclampsia.   DISCHARGE DIAGNOSES:  1.  Intrauterine pregnancy at 32 and six-sevenths weeks status post cesarean      delivery (repeat) of a viable female infant named Christian weighing 4      pounds 2 ounces, Apgars 8 and 8.  2.  Preeclampsia.  3.  Prior cesarean section.  4.  Status post elective bilateral tubal ligation.  5.  True knot in cord.  6.  Small 2 cm fibroid on uterus.  7.  Mild postoperative ileus, resolved.   HOSPITAL PROCEDURES:  1.  Electronic fetal monitoring.  2.  Magnesium sulfate therapy.  3.  Ultrasound.  4.  Spinal anesthesia.  5.  Repeat low transverse cesarean section.  6.  Elective bilateral tubal ligation.  7.  AICU care.   HOSPITAL COURSE:  The patient was admitted with elevated blood pressures,  149/93, and proteinuria.  She was given betamethasone and begun on magnesium  sulfate therapy for preeclampsia.  The liver function tests were mildly  elevated and she was placed on fetal monitoring.  Discussion was held with  her regarding the nature of preeclampsia and decision was made to proceed  conservatively to allow the baby to become more mature.  She did well over  the next days with stable blood pressures in the 120s to 130s over 60s to  80s.  Her labs remained stable.  She had a complete OB ultrasound on  November 14, 2003 which showed fetal weight of 75th to 90th percentile, AFI  of 18, and a posterior placenta.  She did well during her next several days.  Liver function  tests remained within normal limits.  Vital signs remained  130s to 140s over 80s.  Conservative management continued.  The patient did  not experience any complications.  On November 23, 2003 when she had  reached 33 and six-sevenths weeks an elective cesarean section was performed  for a viable female infant named Christian weighing 4 pounds 2 ounces,  Apgars 8 and 8.  A true knot in the cord was found although the baby had  never exhibited any distress.  A small fibroid was noted on the posterior  left fundus of the uterus.  An elective bilateral tubal ligation was also  performed.  EBL was 800 mL and the patient was taken to AICU for monitoring  and recovery, where she did well.  Blood pressures were 100s to 150s over  30s to 80s, I&O was sufficient, oxygenation was 95%, urine output was 900 mL  in the first 8 hours.  She was taken to the mother-baby unit after that.  Aldomet was decreased to 250 mg p.o. b.i.d.  She continued to be well  controlled on that dose.  On postoperative day #2 she developed a mild ileus  with some abdominal distention.  Incision remained dry.  She was given a  suppository and subsequently became less distended, passed flatus, and did  well, was able to tolerate foods.  On postoperative day #3 her blood  pressures were 132 to 146 over 75 to 87.  Edema was trace, abdomen was soft  and appropriately tender, nondistended.  Incision was clean, dry, and  intact.  JP drain drained small amount of serosanguineous fluid.  Lochia was  small, chest was clear, extremities had trace edema.  She was deemed to have  received the full benefit of her hospital stay and was discharged home.  The  baby will remain in NICU until she is able to feed independently.   DISCHARGE MEDICATIONS:  1.  Tylox one to two p.o. q.4h. p.r.n. pain.  2.  Motrin 600 mg p.o. q.6h. p.r.n. pain.  3.  Aldomet 250 mg p.o. b.i.d.   DISCHARGE LABORATORY DATA:  White blood cell count 9.0, hemoglobin  12.1,  platelets 132.  Sodium 135, potassium 3.7, creatinine 0.6, AST 25, ALT 26,  ALP 44, bilirubin less than 0.1.   DISCHARGE INSTRUCTIONS:  Per [facility] handout.   DISCHARGE FOLLOW-UP:  The patient will have blood pressure monitored this  week and then be seen at [facility] in 6 weeks or p.r.n.      MLW/MEDQ  D:  11/26/2003  T:  11/26/2003  Job:  [id number]

## 2010-07-15 NOTE — Op Note (Signed)
Donna Atkinson, Donna Atkinson             ACCOUNT NO.:  [id number]   MEDICAL RECORD NO.:  [id number]          PATIENT TYPE:  INP   LOCATION:  9373                          FACILITY:  [facility]   PHYSICIAN:  Janine Limbo, M.D.DATE OF BIRTH:  04-29-1975   DATE OF PROCEDURE:  11/23/2003  DATE OF DISCHARGE:                                 OPERATIVE REPORT   PREOPERATIVE DIAGNOSES:  1.  34 weeks' gestation.  2.  Preeclampsia.  3.  Prior cesarean section.  4.  Desires repeat cesarean section and sterilization.   POSTOPERATIVE DIAGNOSES:  1.  34 weeks' gestation.  2.  Preeclampsia.  3.  Prior cesarean section.  4.  Desires repeat cesarean section and sterilization.  5.  Fibroid uterus.   PROCEDURE:  Repeat low transverse section and bilateral tubal ligation.   SURGEON:  Janine Limbo, M.D.   FIRST ASSISTANT:  Wynelle Bourgeois, certified nurse midwife.   ANESTHETIC:  Spinal.   DISPOSITION:  Donna Atkinson is a 35 year old female, gravida 2, para 1-0-0-1  who was admitted on 11/09/03 because of preeclampsia.  The patient was noted  to be stable and her labs were stable.  The fetal heart rate was stable.  The decision was made to continue the patient on magnesium until [redacted] weeks  gestation and then proceed with delivery at that point.  The patient  understands the indications for her procedure and she accepts the risks of,  but not limited to anesthetic complications, bleeding, infections, possible  damage to surrounding organs, and possible tubal failure (17 per 1000).   FINDINGS:  A 4 pound, 2 ounce female infant Ephriam Knuckles) was delivered without  difficulty.  The Apgars were 8 at one minute and 8 at five minutes.  There  was a true knot in the cord.  The uterus appeared normal except for a 2 cm  fibroid on the left posterior fundus.  The fallopian tubes and the ovaries  appeared normal.   PROCEDURE IN DETAIL:  The patient was taken to the operating room where a  spinal anesthetic was  given.  The patient's abdomen, perineum, and outer  vagina were prepped with multiple layers of Hibiclens.  A Foley catheter was  placed in the bladder.  The patient was then sterilely draped.  The lower  abdomen was injected with 10 mL of 0.5% Marcaine with epinephrine.  A low  transverse incision was made in the abdomen and carried sharply through the  subcutaneous tissue, the fascia, and the anterior peritoneum.  The bladder  flap was developed.  An incision was made in the lower uterine segment and  extended transversely.  The fetal head was delivered without difficulty.  The mouth and nose were suctioned.  The remainder of the infant was  delivered.  The cord was clamped and the infant was handed to the waiting  pediatric team.  Routine cord blood studies were obtained.  The placenta was  removed.  The uterine cavity was cleaned of amniotic fluid, clotted, blood,  and membranes.  The uterine incision was closed using a running locking  suture of 2-0 Vicryl.  Hemostasis was adequate.  The left fallopian tube was  identified and followed to its fimbriated end.  A knuckle of tube was then  made on the left using a free tie and then ligatures of 0 plain cat gut.  The knuckle of tube thus made was excised.  Hemostasis was adequate.  An  identical procedure was carried out on the opposite side.  Again, hemostasis  was adequate.  The pelvis was then vigorously irrigated.  The anterior  peritoneum and abdominal musculature were reapproximated in the midline.  The fascia was closed using a running suture of 0 Vicryl followed by three  interrupted sutures of 0 Vicryl.  The subcutaneous layer was irrigated.  A  Jackson-Pratt drain was placed in the subcutaneous layer and brought out  through the left lower quadrant.  The drain was sutured into the place using  3-0 silk.  The subcutaneous layer was then closed using a running suture of  0 Vicryl.  The skin was reapproximated using a subcuticular  suture of 4-0  Vicryl.  Sponge, needle, and instrument counts were correct on two  occasions.  The estimated blood loss for the procedure was 800 mL.  The  patient tolerated her procedure well.  The patient was taken to recovery  room in stable condition.  The infant was taken to the neonatal intensive  care unit but was noted to be in good condition.      AVS/MEDQ  D:  11/23/2003  T:  11/24/2003  Job:  [id number]

## 2010-07-15 NOTE — Discharge Summary (Signed)
[facility]  Patient:    Donna Atkinson, Donna Atkinson                      MRN: [id number] Adm. Date:  [id number] Disc. Date: [id number] Attending:  Leonard Schwartz Dictator:   Miguel Dibble, C.N.M.                           Discharge Summary  DATE OF BIRTH:                1975/07/05  ADMISSION DIAGNOSES:          1. Intrauterine pregnancy at 35-5/7 weeks with                                  intrauterine pregnancy growth restriction.                               2. Oligohydramnios with abnormal Doppler flow                                  studies and nonreassuring fetal heart tones.  DISCHARGE DIAGNOSES:          1. Intrauterine pregnancy at 35-5/7 weeks with                                  intrauterine pregnancy growth restriction and                                  oligohydramnios with nonreassuring fetal                                  heart tones.                               2. Delivered viable baby boy, Apgars 8/9 and                                  weighing 3 pounds 9 ounces by lower segment                                  transverse primary cesarean section.  PROCEDURES:                   1. Primary lower segment transverse cesarean                                  section.                               2. Doppler flow studies.  3. Spinal anesthesia.                               4. Electronic fetal monitoring.  HOSPITAL COURSE:              After abnormal Doppler flow studies and decelerations on a contraction stress test occurring with spontaneous contractions, options for management were discussed with the patient and her family. The decision was made to proceed with cesarean section. She delivered a viable baby boy weighing 3 pounds 9 ounces, Apgars 8 and 9, at 35-5/7 weeks by lower segment transverse cesarean section with spinal anesthesia. The baby proceeded to NICU with oxygen support.  On  postoperative day one, February 26, she was progressing well, ambulating, had not yet voided by 10:30 a.m., was using the breast pump, and planned on an IUD for contraception. Her hemoglobin had dropped from 12.3 to 10.4. Platelets had dropped from 123,000 to 104,000. Lungs were clear. Incision was clean, dry and intact.  On postoperative day two, February 27, she continued to recover satisfactorily. Hemoglobin had dropped to 9.6, hematocrit 27.8, white count 11.4, platelets had improved to 113,000. Fundus was firm. She was visiting the baby in NICU, who is stable.  On postoperative day three, February 28, her temperature rose at 8 p.m. to 100.7. Overall, she had been afebrile throughout her hospital stay up until this point. Her incision was clean, dry and intact. Catheter urinalysis sample was negative. Complete blood count was within normal limits. She was kept in the hospital for an additional 24 hours of observation.  On postoperative day four, March 1, she had remained afebrile for greater than 24 hours, hemoglobin had dropped from 10.4 to 9.6, hematocrit 27.8, platelets had gone from 104,000 to 113,000. White count had gone from 11.9 to 11.4. Incision was clean, dry and intact. She was voiding without difficulty, tolerating her diet, using the breast pump. Her baby was improving in NICU and now tolerating feeding by mouth and IV was discontinued with no oxygen support required. She was happy about the babys progress. She was discharged home in stable condition and with followup in five weeks for her postpartum visit and six weeks for her IUD placement.  DISCHARGE INSTRUCTIONS:       Per [facility] OB/GYN booklet.  DISCHARGE FOLLOWUP:           The patient is to follow up in five weeks and six weeks for IUD.  DISCHARGE MEDICATIONS:        Prenatal vitamins, iron supplementation, Tylox, and Motrin. DD:  04/27/00 TD:  04/28/00 Job: [id number]

## 2010-07-15 NOTE — Op Note (Signed)
[facility]  Patient:    Donna Atkinson, Donna Atkinson                      MRN: [id number] Proc. Date: 04/24/00 Adm. Date:  [id number] Attending:  Leonard Schwartz                           Operative Report  PREOPERATIVE DIAGNOSES:         1. Thirty six week gestation.                                 2. Intrauterine growth retardation.                                 3. Oligohydramnios.                                 4. Abnormal Doppler flow studies.  POSTOPERATIVE DIAGNOSES:        1. Thirty six week gestation.                                 2. Intrauterine growth retardation.                                 3. Oligohydramnios.                                 4. Abnormal Doppler flow studies.  OPERATION:                      Primary low transverse cesarean section.  SURGEON:                        Janine Limbo, M.D.  FIRST ASSISTANT:                Vance Gather Duplantis, C.N.M.  ANESTHESIA:                     Spinal.  DISPOSITION:                    Ms. Shorkey is a 35 year old female, gravida 1, para 0 who presented today to our office at [redacted] weeks gestation. An ultrasound showed oligohydramnios and intrauterine growth retardation. The patient had a nonstress test that showed late decelerations.  An ultrasound was done at the [facility] which showed abnormal Doppler flow studies.  The recommendation was that the patient proceed with cesarean delivery.  INFORMED CONSENT:               The patient understands the indications for her procedure, and she accepts the risks of, but not limited to, anesthetic complications, bleeding, infections, and possible damage to the surrounding organs.  FINDINGS:                       Findings are a 3 pound 9 ounce female infant ([place]) was delivered from a cephalic position.  The Apgars were 8 at one minute and  9 at five minutes.  There was a shoulder cord present, and that cord was wrapped around the  babys foot.  The fallopian tubes, ovaries, and the uterus were normal for the gravid state.  DESCRIPTION OF PROCEDURE:       The patient was taken to the operating room where a spinal anesthetic was given.  The patients abdomen and perineum were prepped with multiple layers of Hibiclens.  A Foley catheter was placed in the bladder.  The patient was sterilely draped.  A low transverse incision was made in the abdomen and carried sharply through the subcutaneous tissue, the fascia and the anterior peritoneum.  An incision was made in the lower uterine segment and extended transversely.  The fetal head was delivered without difficulty.  The mouth and nose were suctioned.  The nuchal cord was reduced. The cord was clamped and cut, and the infant was handed to the waiting pediatric team.  Routine cord blood studies were obtained.  The placenta was removed.  The uterine cavity was cleaned of amniotic fluid and clotted blood. The uterine incision was closed using a running locking suture of 2-0 Vicryl. Figure-of-eight sutures of 2-0 Vicryl were required for hemostasis. Hemostasis was adequate.  The pericolonic gutters and the anterior abdominal cavity were then irrigated.  Hemostasis again was noted to be adequate.  All instruments were removed.  The anterior peritoneum and the abdominal musculature resection were reapproximated in the midline.  The fascia and the abdominal musculature were irrigated.  Hemostasis was adequate.  The fascia was closed using a running locking suture of 0 Vicryl followed by three interrupted sutures of 0 Vicryl.  The skin was reapproximated using skin staples.  Sponge, needle and instrument counts were correct on two occasions. The estimated blood loss for the procedure was 700 cc.  The patient was taken to the recovery room in stable condition.  The infant was taken to the intensive care nursery for observation.  The infant was doing quite well, however. DD:   04/24/00 TD:  04/24/00 Job: [id number]

## 2016-06-06 ENCOUNTER — Telehealth: Payer: Self-pay | Admitting: *Deleted

## 2016-06-06 NOTE — Telephone Encounter (Signed)
NOTES SENT TO SCHEDULING.  °

## 2016-06-11 NOTE — Progress Notes (Signed)
Cardiology Office Note   Date:  06/15/2016   ID:  Donna Atkinson, DOB [birth year], MRN [id number]  PCP:  Sissy Hoff, MD  Cardiologist:   Charlton Haws, MD   Chief Complaint  Patient presents with  . Establish Care      History of Present Illness: Donna Atkinson is a 41 y.o. female who presents for evaluation/consultation HTN  Referred by Dr Tally Joe. Reviewed office notes from him on 06/05/16. Descries issues with non compliance with meds. Home readings with 2 week compliance 133-158 mmHg/93-103 mmHg.  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 She does not exercise She eats too much She is compliant with meds. No chest pain mild exertional dyspnea Been Rx for BP since birth of first child and has 12/16 yo kids now   Husband works for environmental services at [employer]   Past Medical History:  Diagnosis Date  . History of pre-eclampsia   . Hypertension     History reviewed. No pertinent surgical history.   Current Outpatient Prescriptions  Medication Sig Dispense Refill  . amLODipine (NORVASC) 10 MG tablet Take 10 mg by mouth daily.    Marland Kitchen losartan-hydrochlorothiazide (HYZAAR) 100-25 MG tablet Take 1 tablet by mouth daily.    . metoprolol succinate (TOPROL-XL) 100 MG 24 hr tablet Take 1 tablet (100 mg total) by mouth daily. Take with or immediately following a meal. 90 tablet 3   No current facility-administered medications for this visit.     Allergies:   Lisinopril and Other    Social History:  The patient  reports that she has never smoked. She has never used smokeless tobacco. She reports that she does not drink alcohol or use drugs.   Family History:  The patient's family history includes Atrial fibrillation in her mother; CVA in her maternal grandfather; Congestive Heart Failure in her mother; Diabetes in her father; Hypertension in her  father, mother, and sister; Lupus in her mother; Stroke in her maternal grandfather; Stroke (age of onset: 28) in her father.    ROS:  Please see the history of present illness.   Otherwise, review of systems are positive for none.   All other systems are reviewed and negative.    PHYSICAL EXAM: VS:  BP (!) 160/80   Pulse (!) 116   Ht  (1.575 m)   Wt 249 lb (112.9 kg)   SpO2 96%   BMI 45.54 kg/m  , BMI Body mass index is 45.54 kg/m. Affect appropriate Obese black female  HEENT: normal Neck supple with no adenopathy JVP normal no bruits no thyromegaly Lungs clear with no wheezing and good diaphragmatic motion Heart:  S1/S2 1/6 SEM murmur, no rub, gallop or click PMI normal Abdomen: benighn, BS positve, no tenderness, no AAA no bruit.  No HSM or HJR Distal pulses intact with no bruits No edema Neuro non-focal Skin warm and dry No muscular weakness    EKG:   SR rate 114 nonspecific ST changes    Recent Labs: No results found for requested labs within last 8760 hours.    Lipid Panel No results found for: CHOL, TRIG, HDL, CHOLHDL, VLDL, LDLCALC, LDLDIRECT    Wt Readings from Last 3 Encounters:  06/15/16 249 lb (112.9 kg)      Other studies Reviewed: Additional studies/ records that were reviewed  today include: Notes from primary care labs and ECG .    ASSESSMENT AND PLAN:  1.  HTN:  Increase toprol to 100 mg daily discussed lifestyle changes including weight loss, exercise and low sodium diet 2. Tachycardia: soft SEM increase Toprol echo to assess LVH, EF and SEM 3. Obesity:  Discussed low carb diet she has very little insight into sodium intake and low carb diet    Current medicines are reviewed at length with the patient today.  The patient does not have concerns regarding medicines.  The following changes have been made:  Increase Toprol to 100 mg   Labs/ tests ordered today include: Echo    Orders Placed This Encounter  Procedures  . EKG  12-Lead  . ECHOCARDIOGRAM COMPLETE     Disposition:   FU with me next available      Signed, Charlton Haws, MD  06/15/2016 11:08 AM    [facility] [address] [place], [place], [place]  [address] Phone: [phone]; Fax: [phone]

## 2016-06-13 ENCOUNTER — Encounter: Payer: Self-pay | Admitting: Cardiovascular Disease

## 2016-06-15 ENCOUNTER — Encounter: Payer: Self-pay | Admitting: Cardiovascular Disease

## 2016-06-15 ENCOUNTER — Ambulatory Visit (INDEPENDENT_AMBULATORY_CARE_PROVIDER_SITE_OTHER): Payer: BC Managed Care – PPO | Admitting: Cardiovascular Disease

## 2016-06-15 VITALS — BP 160/80 | HR 116 | Ht 62.0 in | Wt 249.0 lb

## 2016-06-15 DIAGNOSIS — R000 Tachycardia, unspecified: Secondary | ICD-10-CM

## 2016-06-15 DIAGNOSIS — Z7689 Persons encountering health services in other specified circumstances: Secondary | ICD-10-CM | POA: Diagnosis not present

## 2016-06-15 MED ORDER — METOPROLOL SUCCINATE ER 100 MG PO TB24: 100.0000 mg | ORAL_TABLET | Freq: Every day | ORAL | 3 refills | Status: DC

## 2016-06-15 NOTE — Patient Instructions (Addendum)
Medication Instructions:  Your physician has recommended you make the following change in your medication:  1-Increase metoprolol 100 mg by mouth daily  Labwork: NONE  Testing/Procedures: Your physician has requested that you have an echocardiogram. Echocardiography is a painless test that uses sound waves to create images of your heart. It provides your doctor with information about the size and shape of your heart and how well your heart's chambers and valves are working. This procedure takes approximately one hour. There are no restrictions for this procedure.  Follow-Up: Your physician wants you to follow-up next available/ few weeks with Dr. Eden Emms.  If you need a refill on your cardiac medications before your next appointment, please call your pharmacy.

## 2016-06-28 ENCOUNTER — Telehealth (HOSPITAL_COMMUNITY): Payer: Self-pay | Admitting: Cardiovascular Disease

## 2016-06-28 NOTE — Progress Notes (Signed)
Cardiology Office Note   Date:  06/30/2016   ID:  Donna Atkinson, DOB [birth year], MRN [id number]  PCP:  Sissy Hoff, MD  Cardiologist:   Charlton Haws, MD   Chief Complaint  Patient presents with  . Tachycardia      History of Present Illness: Donna Atkinson is a 41 y.o. female who presents for evaluation/consultation HTN  Referred by Dr Tally Joe. First seen 06/15/16  Reviewed office notes from him on 06/05/16. Descries issues with non compliance with meds. Home readings with 2 week compliance 133-158 mmHg/93-103 mmHg.  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 She does not exercise She eats too much She is compliant with meds. No chest pain mild exertional dyspnea Been Rx for BP since birth of first child and has 12/16 yo kids now   Husband works for environmental services at [employer] increased and echo ordered  Echo 06/29/16 Reviewed  EF 60-65% Mild LVH 14 mm  Grade one diastolic   Past Medical History:  Diagnosis Date  . History of pre-eclampsia   . Hypertension     History reviewed. No pertinent surgical history.   Current Outpatient Prescriptions  Medication Sig Dispense Refill  . amLODipine (NORVASC) 10 MG tablet Take 10 mg by mouth daily.    Marland Kitchen losartan-hydrochlorothiazide (HYZAAR) 100-25 MG tablet Take 1 tablet by mouth daily.    . metoprolol succinate (TOPROL-XL) 100 MG 24 hr tablet Take 1 tablet (100 mg total) by mouth daily. Take with or immediately following a meal. 90 tablet 3   No current facility-administered medications for this visit.     Allergies:   Lisinopril and Other    Social History:  The patient  reports that she has never smoked. She has never used smokeless tobacco. She reports that she does not drink alcohol or use drugs.   Family History:  The patient's family history includes Atrial fibrillation in  her mother; CVA in her maternal grandfather; Congestive Heart Failure in her mother; Diabetes in her father; Hypertension in her father, mother, and sister; Lupus in her mother; Stroke in her maternal grandfather; Stroke (age of onset: 76) in her father.    ROS:  Please see the history of present illness.   Otherwise, review of systems are positive for none.   All other systems are reviewed and negative.    PHYSICAL EXAM: VS:  BP 114/90   Pulse 88   Ht  (1.575 m)   Wt 242 lb 1.9 oz (109.8 kg)   SpO2 93%   BMI 44.28 kg/m  , BMI Body mass index is 44.28 kg/m. Affect appropriate Obese black female  HEENT: normal Neck supple with no adenopathy JVP normal no bruits no thyromegaly Lungs clear with no wheezing and good diaphragmatic motion Heart:  S1/S2 1/6 SEM murmur, no rub, gallop or click PMI normal Abdomen: benighn, BS positve, no tenderness, no AAA no bruit.  No HSM or HJR Distal pulses intact with no bruits No edema Neuro non-focal Skin warm and dry No muscular weakness    EKG:   SR rate 114 nonspecific ST changes    Recent Labs: No results found for requested labs within last 8760 hours.    Lipid Panel No results found for: CHOL, TRIG, HDL, CHOLHDL, VLDL, LDLCALC, LDLDIRECT    Wt  Readings from Last 3 Encounters:  06/30/16 242 lb 1.9 oz (109.8 kg)  06/15/16 249 lb (112.9 kg)      Other studies Reviewed: Additional studies/ records that were reviewed today include: Notes from primary care labs and ECG .    ASSESSMENT AND PLAN:  1.  HTN:  Increase toprol to 100 mg daily discussed lifestyle changes including weight loss, exercise and low sodium diet Improved only mild LVH on echo  2. Tachycardia: improved on beta blocker echo with normal EF and no valve disease  3. Obesity:  Discussed low carb diet she has very little insight into sodium intake and low carb diet    Current medicines are reviewed at length with the patient today.  The patient does not  have concerns regarding medicines.  The following changes have been made:  Increase Toprol to 100 mg   Labs/ tests ordered today include: Echo    No orders of the defined types were placed in this encounter.    Disposition:   FU with me 6 months     Signed, Charlton Haws, MD  06/30/2016 9:35 AM    [facility] [address] [place], [place], [place]  [address] Phone: [phone]; Fax: [phone]

## 2016-06-28 NOTE — Telephone Encounter (Signed)
I contacted patient to r/s her echo appt from 06/29/16 to 07/10/16 due to the tech being out sick. Do you have any suggestions on where I can move her appt with Dr. Eden Emms as a f/u?

## 2016-06-28 NOTE — Progress Notes (Deleted)
Cardiology Office Note   Date:  06/28/2016   ID:  Donna Atkinson, DOB 1976/01/27, MRN [id number]  PCP:  Donna Hoff, MD  Cardiologist:   Charlton Haws, MD   No chief complaint on file.     History of Present Illness: Donna Atkinson is a 41 y.o. female who presents for evaluation/consultation HTN  Referred by Dr Donna Atkinson. First seen 06/15/16  Reviewed office notes from him on 06/05/16. Descries issues with non compliance with meds. Home readings with 2 week compliance 133-158 mmHg/93-103 mmHg.  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 She does not exercise She eats too much She is compliant with meds. No chest pain mild exertional dyspnea Been Rx for BP since birth of first child and has 12/16 yo kids now   Husband works for environmental services at [employer] increased and echo ordered    Past Medical History:  Diagnosis Date  . History of pre-eclampsia   . Hypertension     No past surgical history on file.   Current Outpatient Prescriptions  Medication Sig Dispense Refill  . amLODipine (NORVASC) 10 MG tablet Take 10 mg by mouth daily.    Marland Kitchen losartan-hydrochlorothiazide (HYZAAR) 100-25 MG tablet Take 1 tablet by mouth daily.    . metoprolol succinate (TOPROL-XL) 100 MG 24 hr tablet Take 1 tablet (100 mg total) by mouth daily. Take with or immediately following a meal. 90 tablet 3   No current facility-administered medications for this visit.     Allergies:   Lisinopril and Other    Social History:  The patient  reports that she has never smoked. She has never used smokeless tobacco. She reports that she does not drink alcohol or use drugs.   Family History:  The patient's family history includes Atrial fibrillation in her mother; CVA in her maternal grandfather; Congestive Heart Failure in her mother; Diabetes in her father;  Hypertension in her father, mother, and sister; Lupus in her mother; Stroke in her maternal grandfather; Stroke (age of onset: 93) in her father.    ROS:  Please see the history of present illness.   Otherwise, review of systems are positive for none.   All other systems are reviewed and negative.    PHYSICAL EXAM: VS:  There were no vitals taken for this visit. , BMI There is no height or weight on file to calculate BMI. Affect appropriate Obese black female  HEENT: normal Neck supple with no adenopathy JVP normal no bruits no thyromegaly Lungs clear with no wheezing and good diaphragmatic motion Heart:  S1/S2 1/6 SEM murmur, no rub, gallop or click PMI normal Abdomen: benighn, BS positve, no tenderness, no AAA no bruit.  No HSM or HJR Distal pulses intact with no bruits No edema Neuro non-focal Skin warm and dry No muscular weakness    EKG:   SR rate 114 nonspecific ST changes    Recent Labs: No results found for requested labs within last 8760 hours.    Lipid Panel No results found for: CHOL, TRIG, HDL, CHOLHDL, VLDL, LDLCALC, LDLDIRECT    Wt Readings from Last 3 Encounters:  06/15/16 249 lb (112.9 kg)      Other studies Reviewed: Additional studies/ records that were reviewed today include: Notes from primary care labs and ECG .  ASSESSMENT AND PLAN:  1.  HTN:  Increase toprol to 100 mg daily discussed lifestyle changes including weight loss, exercise and low sodium diet 2. Tachycardia: soft SEM increase Toprol echo to assess LVH, EF and SEM 3. Obesity:  Discussed low carb diet she has very little insight into sodium intake and low carb diet    Current medicines are reviewed at length with the patient today.  The patient does not have concerns regarding medicines.  The following changes have been made:  Increase Toprol to 100 mg   Labs/ tests ordered today include: Echo    No orders of the defined types were placed in this  encounter.    Disposition:   FU with me 6 months     Signed, Charlton Haws, MD  06/28/2016 9:21 AM    [facility] [address] [place], [place], [place]  [address] Phone: [phone]; Fax: [phone]

## 2016-06-28 NOTE — Telephone Encounter (Signed)
Called pt and lmsg stating that her echo had been r/sed to original date and time was 8:30am and her appt with Dr. Eden Emms is still 5/4 at 9:20. I asked her to call back to confirm.

## 2016-06-29 ENCOUNTER — Other Ambulatory Visit (HOSPITAL_COMMUNITY): Payer: BC Managed Care – PPO

## 2016-06-29 ENCOUNTER — Ambulatory Visit (HOSPITAL_COMMUNITY): Payer: BC Managed Care – PPO | Attending: Cardiology

## 2016-06-29 ENCOUNTER — Other Ambulatory Visit: Payer: Self-pay

## 2016-06-29 DIAGNOSIS — R000 Tachycardia, unspecified: Secondary | ICD-10-CM

## 2016-06-29 DIAGNOSIS — I5030 Unspecified diastolic (congestive) heart failure: Secondary | ICD-10-CM | POA: Insufficient documentation

## 2016-06-29 DIAGNOSIS — I517 Cardiomegaly: Secondary | ICD-10-CM | POA: Diagnosis not present

## 2016-06-29 MED ORDER — PERFLUTREN LIPID MICROSPHERE
1.0000 mL | INTRAVENOUS | Status: AC | PRN
  Administered 2016-06-29: 2 mL via INTRAVENOUS

## 2016-06-30 ENCOUNTER — Ambulatory Visit: Payer: BC Managed Care – PPO | Admitting: Cardiovascular Disease

## 2016-06-30 ENCOUNTER — Encounter: Payer: Self-pay | Admitting: Cardiovascular Disease

## 2016-06-30 ENCOUNTER — Ambulatory Visit (INDEPENDENT_AMBULATORY_CARE_PROVIDER_SITE_OTHER): Payer: BC Managed Care – PPO | Admitting: Cardiovascular Disease

## 2016-06-30 ENCOUNTER — Encounter (INDEPENDENT_AMBULATORY_CARE_PROVIDER_SITE_OTHER): Payer: Self-pay

## 2016-06-30 VITALS — BP 114/90 | HR 88 | Ht 62.0 in | Wt 242.1 lb

## 2016-06-30 DIAGNOSIS — R000 Tachycardia, unspecified: Secondary | ICD-10-CM | POA: Diagnosis not present

## 2016-06-30 NOTE — Patient Instructions (Signed)
Medication Instructions:  Your physician recommends that you continue on your current medications as directed. Please refer to the Current Medication list given to you today.  Labwork: NONE  Testing/Procedures: NONE  Follow-Up: Your physician wants you to follow-up as needed with  Dr. Nishan.    If you need a refill on your cardiac medications before your next appointment, please call your pharmacy.    

## 2016-07-10 ENCOUNTER — Other Ambulatory Visit (HOSPITAL_COMMUNITY): Payer: BC Managed Care – PPO

## 2018-01-10 ENCOUNTER — Telehealth: Payer: Self-pay

## 2018-01-10 NOTE — Telephone Encounter (Signed)
SENT REFERRAL TO SCHEDULING AND FILED NOTES 

## 2018-09-20 ENCOUNTER — Telehealth: Payer: Self-pay

## 2018-09-20 NOTE — Telephone Encounter (Signed)
Called pt to change appointment to virtual or reschedule at later date for in office. Left message to call back.

## 2018-09-23 ENCOUNTER — Encounter: Payer: Self-pay | Admitting: Cardiology

## 2018-09-23 ENCOUNTER — Telehealth: Payer: Self-pay | Admitting: *Deleted

## 2018-09-23 ENCOUNTER — Telehealth (INDEPENDENT_AMBULATORY_CARE_PROVIDER_SITE_OTHER): Payer: BC Managed Care – PPO | Admitting: Cardiology

## 2018-09-23 VITALS — BP 156/97 | Ht 62.0 in

## 2018-09-23 DIAGNOSIS — I10 Essential (primary) hypertension: Secondary | ICD-10-CM | POA: Diagnosis not present

## 2018-09-23 DIAGNOSIS — Z713 Dietary counseling and surveillance: Secondary | ICD-10-CM | POA: Diagnosis not present

## 2018-09-23 DIAGNOSIS — Z7189 Other specified counseling: Secondary | ICD-10-CM | POA: Diagnosis not present

## 2018-09-23 DIAGNOSIS — Z6841 Body Mass Index (BMI) 40.0 and over, adult: Secondary | ICD-10-CM

## 2018-09-23 MED ORDER — CARVEDILOL 12.5 MG PO TABS: 12.5000 mg | ORAL_TABLET | Freq: Two times a day (BID) | ORAL | 3 refills | Status: DC

## 2018-09-23 NOTE — Telephone Encounter (Signed)

## 2018-09-23 NOTE — Telephone Encounter (Signed)
Left message for patient to call and schedule 2 week f/u appointment with Dr. Harrell Gave per 09/23/18 staff message

## 2018-09-23 NOTE — Patient Instructions (Signed)
Medication Instructions:  Stop: Metoprolol 100 mg daily Start: Carvedilol 12.5 mg two times a day   If you need a refill on your cardiac medications before your next appointment, please call your pharmacy.   Lab work: None  Testing/Procedures: None  Follow-Up: At [facility], you and your health needs are our priority.  As part of our continuing mission to provide you with exceptional heart care, we have created designated Provider Care Teams.  These Care Teams include your primary Cardiologist (physician) and Advanced Practice Providers (APPs -  Physician Assistants and Nurse Practitioners) who all work together to provide you with the care you need, when you need it. You will need a follow up appointment in 2 weeks.  Please call our office 2 months in advance to schedule this appointment.  You may see Dr. Harrell Gave or one of the following Advanced Practice Providers on your designated Care Team:   Rosaria Ferries, PA-C . Jory Sims, DNP, ANP

## 2018-09-23 NOTE — Progress Notes (Signed)
Virtual Visit via Video Note   This visit type was conducted due to national recommendations for restrictions regarding the COVID-19 Pandemic (e.g. social distancing) in an effort to limit this patient's exposure and mitigate transmission in our community.  Due to her co-morbid illnesses, this patient is at least at moderate risk for complications without adequate follow up.  This format is felt to be most appropriate for this patient at this time.  All issues noted in this document were discussed and addressed.  A limited physical exam was performed with this format.  Please refer to the patient's chart for her consent to telehealth for [facility].   Date:  09/23/2018   ID:  Donna Atkinson, DOB 05-07-1975, MRN [id number]  Patient Location: Home Provider Location: Home  PCP:  Tally JoeSwayne, David, MD  Cardiologist:  No primary care provider on file. Previously Dr. Eden EmmsNishan Electrophysiologist:  None   Evaluation Performed:  Follow-Up Visit  Chief Complaint:  Follow up  History of Present Illness:    Donna Atkinson is a 43 y.o. female with PMH hypertension, tachycardia, obesity, history of pre-eclampsia. She was last seen by Dr. Eden EmmsNishan 06/30/2016.  The patient does not have symptoms concerning for COVID-19 infection (fever, chills, cough, or new shortness of breath).   Today: Patient concerns: was off losartan for a while due to recall, went back on it and didn't see an improvement in her BP. Trying to spread there medications over the day. Takes HCTZ in the AM, works well. Takes amlodipine, metoprolol, losartan each about 2-3 hours later than the other. Tries to take last med by 9 PM.   BP at home  148/89, HR 97 156/97, HR 107 129/81, HR 88 152/107, HR 114 182/114, HR 110  Denies chest pain, shortness of breath at rest or with normal exertion. No PND, orthopnea, LE edema or unexpected weight gain. No syncope or palpitations.   Past Medical History:  Diagnosis Date  . History  of pre-eclampsia   . Hypertension    No past surgical history on file.   Current Meds  Medication Sig  . amLODipine (NORVASC) 10 MG tablet Take 10 mg by mouth daily.  . hydrochlorothiazide (HYDRODIURIL) 25 MG tablet Take 25 mg by mouth daily.  Marland Kitchen. losartan (COZAAR) 100 MG tablet Take 100 mg by mouth daily.  . metoprolol succinate (TOPROL-XL) 100 MG 24 hr tablet Take 1 tablet (100 mg total) by mouth daily. Take with or immediately following a meal.     Allergies:   Lisinopril and Other   Social History   Tobacco Use  . Smoking status: Never Smoker  . Smokeless tobacco: Never Used  Substance Use Topics  . Alcohol use: No  . Drug use: No     Family Hx: The patient's family history includes Atrial fibrillation in her mother; CVA in her maternal grandfather; Congestive Heart Failure in her mother; Diabetes in her father; Hypertension in her father, mother, and sister; Lupus in her mother; Stroke in her maternal grandfather; Stroke (age of onset: 1649) in her father.  ROS:   Please see the history of present illness.    Constitutional: Negative for chills, fever, night sweats, unintentional weight loss  HENT: Negative for ear pain and hearing loss.   Eyes: Negative for loss of vision and eye pain.  Respiratory: Negative for cough, sputum, wheezing.   Cardiovascular: See HPI. Gastrointestinal: Negative for abdominal pain, melena, and hematochezia.  Genitourinary: Negative for dysuria and hematuria.  Musculoskeletal: Negative for  falls and myalgias.  Skin: Negative for itching and rash.  Neurological: Negative for focal weakness, focal sensory changes and loss of consciousness.  Endo/Heme/Allergies: Does not bruise/bleed easily.  All other systems reviewed and are negative.   Prior CV studies:   The following studies were reviewed today: Echo 06/2016 - Left ventricle: The cavity size was normal. Wall thickness was   increased in a pattern of mild LVH. Systolic function was normal.    The estimated ejection fraction was in the range of 60% to 65%.   Wall motion was normal; there were no regional wall motion   abnormalities. Doppler parameters are consistent with abnormal   left ventricular relaxation (grade 1 diastolic dysfunction). - Aortic valve: There was no stenosis. - Mitral valve: There was no significant regurgitation. - Right ventricle: The cavity size was normal. Systolic function   was normal. - Pulmonary arteries: No complete TR doppler jet so unable to   estimate PA systolic pressure.  Impressions:  - Normal LV size with mild LV hypertrophy. EF 60-65%. Normal RV   size and systolic function. No significant valvular   abnormalities.  Labs/Other Tests and Data Reviewed:    EKG:  An ECG dated 06/15/16 was personally reviewed today and demonstrated:  sinus tach at 114 bpm  Recent Labs: No results found for requested labs within last 8760 hours.   Recent Lipid Panel No results found for: CHOL, TRIG, HDL, CHOLHDL, LDLCALC, LDLDIRECT  Wt Readings from Last 3 Encounters:  06/30/16 242 lb 1.9 oz (109.8 kg)  06/15/16 249 lb (112.9 kg)     Objective:    Vital Signs:  BP (!) 156/97   Ht 5\' 2"  (1.575 m)   BMI 44.28 kg/m    VITAL SIGNS:  reviewed GEN:  no acute distress EYES:  sclerae anicteric, EOMI - Extraocular Movements Intact RESPIRATORY:  normal respiratory effort, symmetric expansion CARDIOVASCULAR:  no visible JVD SKIN:  no rash, lesions or ulcers. MUSCULOSKELETAL:  no obvious deformities. NEURO:  alert and oriented x 3, no obvious focal deficit PSYCH:  normal affect  ASSESSMENT & PLAN:    Hypertension -change metoprolol to carvedilol; start at 12.5 mg BID and uptitrate -planned for labs this Friday -as she did not respond to losartan, consider either changing to another ARB or dropping for spironolactone -continue amlodipine -continue HCTZ for now, consider changing to chlorthalidone at future visit  Obesity: BMI 44, her CV risk  would improve with weight loss  Nutrition Counseling, Exercise counseling -as below. Discussed low sodium diet specifically. Discussed importance of gradual increase in exercise to [facility] goals  CV risk assessment and prevention counseling -recommend heart healthy/Mediterranean diet, with whole grains, fruits, vegetable, fish, lean meats, nuts, and olive oil. Limit salt. -recommend moderate walking, 3-5 times/week for 30-50 minutes each session. Aim for at least 150 minutes.week. Goal should be pace of 3 miles/hours, or walking 1.5 miles in 30 minutes -recommend avoidance of tobacco products. Avoid excess alcohol. -Additional risk factor control:  -Diabetes: A1c is not available, with risk factors would check if not done with upcoming labs  -Lipids: From 06/2017: Tchol 180, LDL 103, HDL 50, TG 131  -Blood pressure control: as above  -Weight: Obese, BMI 44. Would benefit from weight loss -ASCVD risk score: Today is 6.1% with BP, prior labs  COVID-19 Education: The signs and symptoms of COVID-19 were discussed with the patient and how to seek care for testing (follow up with PCP or arrange E-visit).  The importance of social  distancing was discussed today.  Time:   Today, I have spent 25 minutes with the patient with telehealth technology discussing the above problems.     Medication Adjustments/Labs and Tests Ordered: Current medicines are reviewed at length with the patient today.  Concerns regarding medicines are outlined above.   Patient Instructions  Medication Instructions:  Stop: Metoprolol 100 mg daily Start: Carvedilol 12.5 mg two times a day   If you need a refill on your cardiac medications before your next appointment, please call your pharmacy.   Lab work: None  Testing/Procedures: None  Follow-Up: At [facility], you and your health needs are our priority.  As part of our continuing mission to provide you with exceptional heart care, we have created designated  Provider Care Teams.  These Care Teams include your primary Cardiologist (physician) and Advanced Practice Providers (APPs -  Physician Assistants and Nurse Practitioners) who all work together to provide you with the care you need, when you need it. You will need a follow up appointment in 2 weeks.  Please call our office 2 months in advance to schedule this appointment.  You may see Dr. Cristal Deerhristopher or one of the following Advanced Practice Providers on your designated Care Team:   Theodore DemarkRhonda Barrett, PA-C . Joni ReiningKathryn Lawrence, DNP, ANP      Signed, Jodelle RedBridgette Ileigh Mettler, MD  09/23/2018  [facility]

## 2018-09-27 ENCOUNTER — Encounter: Payer: Self-pay | Admitting: Cardiology

## 2018-09-27 NOTE — Telephone Encounter (Signed)
Left message for patient to call and schedule 2 week follow up appointment with Dr. Harrell Gave

## 2018-10-06 NOTE — Progress Notes (Signed)
Cardiology Office Note   Date:  10/07/2018   ID:  Donna Atkinson, DOB 1975-08-13, MRN [id number]  PCP:  Tally JoeSwayne, David, MD  Cardiologist: Dr.Christopher  No chief complaint on file.    History of Present Illness: Donna Atkinson is a 43 y.o. female who presents for ongoing assessment of hypertension, tachycardia, obesity, and pre-eclampsia.  Was last seen by Dr. Cristal Deerhristopher on 09/23/2018 via telemedicine video.  Mrs. Earl LitesGregory had concerns about BP, with home readings between 129/81 to 182/114.  She was on HCTZ, amlodipine, metoprolol and losartan.   Dr.Christopher changed the metoprolol to carvedilol, 12.5 mg BID. With consideration to change to another ARB or decreasing ARB,  to add spironolactone. Continued on HCTZ with change to chlorthalidone on follow up.  She comes today in a rush as she was late. HR is up and BP is elevated. I rechecked it when she had rested 158/82.She is tachycardic. She denies any dizziness or fatigue on carvedilol.   Past Medical History:  Diagnosis Date  . History of pre-eclampsia   . Hypertension     No past surgical history on file.   Current Outpatient Medications  Medication Sig Dispense Refill  . amLODipine (NORVASC) 10 MG tablet Take 10 mg by mouth daily.    . carvedilol (COREG) 12.5 MG tablet Take 1 tablet (12.5 mg total) by mouth 2 (two) times daily. 180 tablet 3  . hydrochlorothiazide (HYDRODIURIL) 25 MG tablet Take 25 mg by mouth daily.    Marland Kitchen. losartan (COZAAR) 100 MG tablet Take 100 mg by mouth daily.    Marland Kitchen. spironolactone (ALDACTONE) 25 MG tablet Take 0.5 tablets (12.5 mg total) by mouth daily. 90 tablet 3   No current facility-administered medications for this visit.     Allergies:   Lisinopril and Other    Social History:  The patient  reports that she has never smoked. She has never used smokeless tobacco. She reports that she does not drink alcohol or use drugs.   Family History:  The patient's family history includes Atrial  fibrillation in her mother; CVA in her maternal grandfather; Congestive Heart Failure in her mother; Diabetes in her father; Hypertension in her father, mother, and sister; Lupus in her mother; Stroke in her maternal grandfather; Stroke (age of onset: 5649) in her father.    ROS: All other systems are reviewed and negative. Unless otherwise mentioned in H&P    PHYSICAL EXAM: VS:  BP (!) 150/98   Pulse (!) 108   Temp 97.9 F (36.6 C) (Temporal)   Ht 5\' 2"  (1.575 m)   Wt 283 lb 6.4 oz (128.5 kg)   SpO2 94%   BMI 51.83 kg/m  , BMI Body mass index is 51.83 kg/m. GEN: Well nourished, well developed, in no acute distress, morbidly obese HEENT: normal Neck: no JVD, carotid bruits, or masses Cardiac: RRR;tachycvardia  no murmurs, rubs, or gallops,no edema  Respiratory:  Clear to auscultation bilaterally, normal work of breathing GI: soft, nontender, nondistended, + BS MS: no deformity or atrophy Skin: warm and dry, no rash Neuro:  Strength and sensation are intact Psych: euthymic mood, full affect   EKG:  Not completed this office visit.    Recent Labs: No results found for requested labs within last 8760 hours.    Lipid Panel No results found for: CHOL, TRIG, HDL, CHOLHDL, VLDL, LDLCALC, LDLDIRECT    Wt Readings from Last 3 Encounters:  10/07/18 283 lb 6.4 oz (128.5 kg)  06/30/16 242 lb 1.9 oz (  109.8 kg)  06/15/16 249 lb (112.9 kg)      Other studies Reviewed: Echo 06/2016 - Left ventricle: The cavity size was normal. Wall thickness was increased in a pattern of mild LVH. Systolic function was normal. The estimated ejection fraction was in the range of 60% to 65%. Wall motion was normal; there were no regional wall motion abnormalities. Doppler parameters are consistent with abnormal left ventricular relaxation (grade 1 diastolic dysfunction). - Aortic valve: There was no stenosis. - Mitral valve: There was no significant regurgitation. - Right ventricle: The  cavity size was normal. Systolic function was normal. - Pulmonary arteries: No complete TR doppler jet so unable to estimate PA systolic pressure.  Impressions:  - Normal LV size with mild LV hypertrophy. EF 60-65%. Normal RV size and systolic function. No significant valvular abnormalities.  ASSESSMENT AND PLAN:  1.Hypertension: Blood pressure is elevated today and she was running late but she states that it is much lower at home running in the 115-120 range per her machine.  I have taken her blood pressure twice and it still remains elevated with some tachycardia.  I am to start her on low-dose spironolactone in addition to her current medication regimen.    Although she is tachycardic, I am reluctant to go up on carvedilol at this time as she was running late and then a hurry to get here.  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  She will see [place] again in 1 month with a BMET in 2 weeks.  If creatinine remains stable can consider changing to chlorthalidone as suggested by Dr. Harrell Gave in her last note.  She has also been counseled on low-sodium diet.  She has not restricted her salt intake.  She states that she had a pork chop sandwich for breakfast this morning.  I have advised her on eliminating as much salt from her diet as possible to assist in blood pressure control.   2.  Probable OSA: Patient is morbidly obese with large neck, and admits to snoring at night but is uncertain if she stops breathing.  Going to do a home sleep study to evaluate need for CPAP.  Evaluating OSA as part of the etiology for continued elevated BP on multiple meds.  Current medicines are reviewed at length with the patient today.    Labs/ tests ordered today include: BMET  Phill Myron. West Pugh, ANP, AACC   10/07/2018 11:54 AM    [facility] [address] Office [phone] Fax [phone]

## 2018-10-07 ENCOUNTER — Encounter: Payer: Self-pay | Admitting: Adult Health

## 2018-10-07 ENCOUNTER — Ambulatory Visit: Payer: BC Managed Care – PPO | Admitting: Adult Health

## 2018-10-07 ENCOUNTER — Other Ambulatory Visit: Payer: Self-pay

## 2018-10-07 VITALS — BP 150/98 | HR 108 | Temp 97.9°F | Ht 62.0 in | Wt 283.4 lb

## 2018-10-07 DIAGNOSIS — I10 Essential (primary) hypertension: Secondary | ICD-10-CM | POA: Diagnosis not present

## 2018-10-07 DIAGNOSIS — G4730 Sleep apnea, unspecified: Secondary | ICD-10-CM

## 2018-10-07 DIAGNOSIS — Z79899 Other long term (current) drug therapy: Secondary | ICD-10-CM | POA: Diagnosis not present

## 2018-10-07 MED ORDER — SPIRONOLACTONE 25 MG PO TABS: 12.5000 mg | ORAL_TABLET | Freq: Every day | ORAL | 3 refills | Status: DC

## 2018-10-07 NOTE — Patient Instructions (Signed)
Medication Instructions:  START- Spironolactone 12.5 mg by mouth daily  If you need a refill on your cardiac medications before your next appointment, please call your pharmacy.  Labwork: BMP in 2 weeks HERE IN OUR OFFICE AT [facility]  You will need to fast. DO NOT EAT OR DRINK PAST MIDNIGHT.     You will NOT need to fast   Take the provided lab slips with you to the lab for your blood draw.   When you have your labs (blood work) drawn today and your tests are completely normal, you will receive your results only by MyChart Message (if you have MyChart) -OR-  A paper copy in the mail.  If you have any lab test that is abnormal or we need to change your treatment, we will call you to review these results.  Testing/Procedures: Your physician has recommended that you have a home sleep study. This test records several body functions during sleep, including: brain activity, eye movement, oxygen and carbon dioxide blood levels, heart rate and rhythm, breathing rate and rhythm, the flow of air through your mouth and nose, snoring, body muscle movements, and chest and belly movement.  Follow-Up: . Your physician recommends that you schedule a follow-up appointment in: 1 Month Dr Harrell Gave   At [facility], you and your health needs are our priority.  As part of our continuing mission to provide you with exceptional heart care, we have created designated Provider Care Teams.  These Care Teams include your primary Cardiologist (physician) and Advanced Practice Providers (APPs -  Physician Assistants and Nurse Practitioners) who all work together to provide you with the care you need, when you need it.  Thank you for choosing [facility] at [facility]!!

## 2018-10-09 ENCOUNTER — Telehealth: Payer: Self-pay | Admitting: *Deleted

## 2018-10-09 NOTE — Telephone Encounter (Signed)
Faxed HST PA request form to [employer]@ [phone].

## 2018-11-07 ENCOUNTER — Ambulatory Visit: Payer: BC Managed Care – PPO | Admitting: Cardiology

## 2018-11-18 ENCOUNTER — Encounter (HOSPITAL_BASED_OUTPATIENT_CLINIC_OR_DEPARTMENT_OTHER): Payer: BC Managed Care – PPO | Admitting: Cardiovascular Disease

## 2018-12-18 ENCOUNTER — Encounter (HOSPITAL_BASED_OUTPATIENT_CLINIC_OR_DEPARTMENT_OTHER): Payer: BC Managed Care – PPO | Admitting: Cardiovascular Disease

## 2019-10-06 ENCOUNTER — Other Ambulatory Visit: Payer: Self-pay | Admitting: Cardiology

## 2019-10-06 DIAGNOSIS — I10 Essential (primary) hypertension: Secondary | ICD-10-CM

## 2019-11-05 ENCOUNTER — Other Ambulatory Visit: Payer: Self-pay | Admitting: Cardiology

## 2019-11-05 DIAGNOSIS — I10 Essential (primary) hypertension: Secondary | ICD-10-CM

## 2019-12-13 ENCOUNTER — Ambulatory Visit: Payer: BC Managed Care – PPO | Attending: Internal Medicine

## 2019-12-13 DIAGNOSIS — Z23 Encounter for immunization: Secondary | ICD-10-CM

## 2019-12-13 NOTE — Progress Notes (Signed)
   Covid-19 Vaccination Clinic  Name:  Donna Atkinson    MRN: [id number] DOB: [birth year]  12/13/2019  Ms. Fair was observed post Covid-19 immunization for 15 minutes without incident. She was provided with Vaccine Information Sheet and instruction to access the V-Safe system.   [name] was instructed to call 911 with any severe reactions post vaccine: Marland Kitchen Difficulty breathing  . Swelling of face and throat  . A fast heartbeat  . A bad rash all over body  . Dizziness and weakness

## 2019-12-29 ENCOUNTER — Other Ambulatory Visit: Payer: Self-pay | Admitting: Adult Health

## 2023-09-18 ENCOUNTER — Encounter: Payer: Self-pay | Admitting: Obstetrics and Gynecology

## 2023-10-01 NOTE — Progress Notes (Signed)
 Virtual Visit  Subjective: Subjective Patient ID: Donna Atkinson is a 48 y.o. female.  HPI  Patient confirmed they are in -[place] Patient has provided virtual consent for medical evaluation and management. I discussed the limitations of evaluation and management by telemedicine and patient expressed understanding and agreed to proceed: Patient performed self-exam with provider guided assistance.   Nose Complaint Duration of current symptoms:  2 weeks Onset quality:  Suddenly Associated symptoms: nasal congestion, headache (SINUS) and sinus pain   Associated symptoms: no chills, no cough, no fatigue, no fever and no sore throat   Treatments tried:  Over-the-counter medication, decongestants and saline spray/rinse (SUDAFED AND GENERIC COLD MEDICATION) Response to treatment:  Relieved symptoms Relief: mild relief   Special considerations:  None apply  Review of Systems  Constitutional:  Negative for chills, fatigue and fever.  HENT:  Positive for congestion, ear pain (LEFT), sinus pressure and sinus pain. Negative for ear discharge, hearing loss, sore throat and trouble swallowing.   Eyes:  Negative for visual disturbance.  Respiratory: Negative.  Negative for cough, chest tightness, shortness of breath and wheezing.   Cardiovascular: Negative.  Negative for chest pain and leg swelling.  Gastrointestinal: Negative.   Musculoskeletal: Negative.   Skin:  Negative for color change.  Neurological:  Positive for headaches (SINUS). Negative for dizziness, facial asymmetry and weakness.  Psychiatric/Behavioral:  Negative for confusion.   All other systems reviewed and are negative.  Social History   Tobacco Use  Smoking Status Never  . Passive exposure: Never  Smokeless Tobacco Never  Tobacco Comments   Don't start    Past Medical History:  Diagnosis Date  . Hypertension    Past Surgical History:  Procedure Laterality Date  . CESAREAN SECTION     History reviewed. No pertinent  family history.  Objective: Objective Physical Exam Constitutional:      General: She is not in acute distress.    Appearance: Normal appearance. She is not ill-appearing, toxic-appearing or diaphoretic.     Comments: Pleasant. No acute distress.  HENT:     Head: Normocephalic and atraumatic. No right periorbital erythema or left periorbital erythema.     Right Ear: Hearing normal. No drainage, swelling or tenderness. No mastoid tenderness.     Left Ear: Hearing normal. Tenderness present. No drainage or swelling. No mastoid tenderness.     Ears:     Comments: Provider instructed patient to assess for ear tenderness/discomfort by palpating the tragus, pinna and mastoid bone    Nose: Congestion present. No nasal tenderness or rhinorrhea.     Right Sinus: No maxillary sinus tenderness or frontal sinus tenderness.     Left Sinus: Maxillary sinus tenderness and frontal sinus tenderness present.     Comments: Provider instructed patient to assess for sinus pressure/tenderness- patient is able to properly assess- tenderness as documented.     Mouth/Throat:     Lips: Pink. No lesions.     Mouth: Mucous membranes are moist.  Eyes:     General: Vision grossly intact. Gaze aligned appropriately. No scleral icterus.    Extraocular Movements: Extraocular movements intact.     Conjunctiva/sclera: Conjunctivae normal.     Pupils: Pupils are equal.  Neck:     Comments: Patient instructed to look up, down, left, and right. Normal active ROM observed. Pulmonary:     Effort: Pulmonary effort is normal. No tachypnea, accessory muscle usage or respiratory distress.     Comments: Patient able to inspire/expire without coughing, wheezing or  respiratory distress. Unlabored breathing noted. Respiratory effort normal. Patient is able to talk in complete sentences. Pt appears to be comfortable at rest and with movement, negative for presence of SOB, visual observation of skin, lip color and tone- wnl, negative  for cyanosis.  Unable to physically assess lung sounds due to visit being conducted virtually.   Musculoskeletal:     Cervical back: Full passive range of motion without pain and normal range of motion. No rigidity.  Lymphadenopathy:     Head:     Right side of head: No tonsillar, preauricular or posterior auricular adenopathy.     Left side of head: No tonsillar, preauricular or posterior auricular adenopathy.     Cervical: No cervical adenopathy.     Comments: Provider instructed patient to assess for lymph node enlargement/tenderness.   Skin:    Coloration: Skin is not ashen, cyanotic, jaundiced, mottled, pale or sallow.  Neurological:     General: No focal deficit present.     Mental Status: She is alert and oriented to person, place, and time.     Cranial Nerves: No facial asymmetry.  Psychiatric:        Attention and Perception: Attention normal.        Mood and Affect: Mood and affect normal.        Speech: Speech normal.        Behavior: Behavior normal. Behavior is cooperative.        Cognition and Memory: Cognition normal.       Assessment/Plan: Assessment HPI provided by Self  Based on today's visit:history and physical exam only, as no relevant testing deemed necessary patient's visit diagnosis is/includes  1. Acute non-recurrent pansinusitis   2. Otalgia of left ear   3. Congestion of nasal sinus    Patient has a history of chronic conditions and those listed in the visit diagnoses were reviewed today. They are currently stable on medications.   Plan Treatment plan includes:  Orders Placed: No orders of the defined types were placed in this encounter.  Medications ordered this visit   Signed Prescriptions Disp Refills  . amoxicillin-clavulanate (AUGMENTIN) 875-125 mg tablet 14 tablet 0    Sig: Take 1 tablet by mouth 2 (two) times a day for 7 days  . guaiFENesin (MUCINEX) 600 mg Ta12      Sig: Take 600-1,200 mg by mouth 2 (two) times a day as needed  (cough) for up to 7 days Max daily dose: 2400mg .  . sodium chloride (OCEAN) 0.65 % nasal spray      Sig: Instill 1 spray into each nostril 4 (four) times a day as needed for congestion for up to 10 days    Current medication list and any new medications prescribed or recommended today were reviewed with the patient and specific instructions were provided Yes  Provider Recommendations   As discussed:  Stop taking the sudafed decongestant as this medication can raise your blood pressure.  Rest and drink plenty of water/fluids (unless your doctor has told you otherwise) Use a humidifier or run a hot shower to create steam 3-4 times a day for  approximately 10 minutes at a time (This helps lessen congestion)  Use salt water sprays (saline sprays) as directed Apply a warm, moist washcloth to your face 3-4 times a day If you were prescribed an antibiotic finish it all even if you start to feel better Avoid tobacco smoke and other environmental irritants    -Follow up with PCP or Urgent  Care if symptoms worsen/fail to improve within 48-72 hours -911/ER for chest pain, shortness of breath/difficulty breathing, cough productive of blood tinged/rust colored mucus, severe headache, neck stiffness, or worsening of symptoms    I strive for excellence with each patient encounter. A survey will be sent to your email in the next 72 hours.  If you were pleased with your services today, please kindly rate your visit 10.  Thank you for trusting me with your care.   If you have any questions or concerns about your visit, please contact our virtual care customer support center at [phone]    Follow up care instructions were provided and reviewed?with the  Patient. All questions were answered. Patient verbalized understanding of plan of care today.                                               I am having Harlene Hacker start on amoxicillin-clavulanate, guaiFENesin, and  sodium chloride. I am also having her maintain her amLODIPine, hydroCHLOROthiazide, losartan, spironolactone , and carvediloL .  I have verified the patient's location, and I am licensed to practice in that state. Audio and video technology were used to conduct this virtual visit. Patient (or parent/guardian as applicable) consented to virtual care.  Patient is: not a minor

## 2023-10-09 ENCOUNTER — Other Ambulatory Visit: Payer: Self-pay | Admitting: Obstetrics and Gynecology

## 2023-10-09 ENCOUNTER — Other Ambulatory Visit: Payer: Self-pay | Admitting: Interventional Radiology

## 2023-10-09 DIAGNOSIS — D259 Leiomyoma of uterus, unspecified: Secondary | ICD-10-CM

## 2023-10-18 ENCOUNTER — Ambulatory Visit
Admission: RE | Admit: 2023-10-18 | Discharge: 2023-10-18 | Disposition: A | Discharge status: Home / Self Care | Payer: Self-pay | Source: Ambulatory Visit | Attending: Interventional Radiology | Admitting: Interventional Radiology

## 2023-10-18 DIAGNOSIS — D259 Leiomyoma of uterus, unspecified: Secondary | ICD-10-CM

## 2023-10-18 MED ORDER — GADOPICLENOL 0.5 MMOL/ML IV SOLN
10.0000 mL | Freq: Once | INTRAVENOUS | Status: AC | PRN
  Administered 2023-10-18: 10 mL via INTRAVENOUS

## 2023-10-24 NOTE — Consult Note (Signed)
 Chief Complaint: Patient was seen in consultation today for symptomatic uterine fibroids.   Referring Physician(s): Cousins,Sheronette  Supervising Physician: Karalee Beat  Patient Status: [facility] - outpatient   History of Present Illness: Donna Atkinson is a 48 y.o. female with a medical history significant for  HTN, tachycardia, obesity and uterine leiomyoma. She was referred to Interventional Radiology to discuss possible treatment options and an MR pelvis was ordered for further evaluation. This study was completed 10/18/23 and she presents to the clinic today for further discussion.   Past Medical History:  Diagnosis Date   History of pre-eclampsia    Hypertension     No past surgical history on file.  Allergies: Lisinopril and Other  Medications: Prior to Admission medications   Medication Sig Start Date End Date Taking? Authorizing Provider  amLODipine (NORVASC) 10 MG tablet Take 10 mg by mouth daily.    [provider]  carvedilol  (COREG ) 12.5 MG tablet TAKE 1 TABLET BY MOUTH 2 TIMES DAILY. 11/05/19   Lonni Slain, MD  hydrochlorothiazide (HYDRODIURIL) 25 MG tablet Take 25 mg by mouth daily.    [provider]  losartan (COZAAR) 100 MG tablet Take 100 mg by mouth daily.    [provider]  spironolactone  (ALDACTONE ) 25 MG tablet TAKE 1/2 TABLET BY MOUTH EVERY DAY 12/30/19   Lonni Slain, MD     Family History  Problem Relation Age of Onset   Hypertension Mother    Lupus Mother    Congestive Heart Failure Mother    Atrial fibrillation Mother    Hypertension Father    Diabetes Father    Stroke Father 60   Hypertension Sister    CVA Maternal Grandfather    Stroke Maternal Grandfather     Social History   Socioeconomic History   Marital status: Married    Spouse name: Not on file   Number of children: 2   Years of education: Not on file   Highest education level: Not on file  Occupational  History   Occupation: TEACHER  Tobacco Use   Smoking status: Never   Smokeless tobacco: Never  Substance and Sexual Activity   Alcohol use: No   Drug use: No   Sexual activity: Not on file  Other Topics Concern   Not on file  Social History Narrative   Not on file   Social Drivers of Health   Financial Resource Strain: Not on file  Food Insecurity: Not on file  Transportation Needs: Not on file  Physical Activity: Not on file  Stress: Not on file  Social Connections: Not on file    Review of Systems: A 12 point ROS discussed and pertinent positives are indicated in the HPI above.  All other systems are negative.  Review of Systems  Vital Signs: There were no vitals taken for this visit.  Physical Exam  Imaging:  MR Pelvis 10/18/23    Labs:  CBC: No results for input(s): WBC, HGB, HCT, PLT in the last 8760 hours.  COAGS: No results for input(s): INR, APTT in the last 8760 hours.  BMP: No results for input(s): NA, K, CL, CO2, GLUCOSE, BUN, CALCIUM, CREATININE, GFRNONAA, GFRAA in the last 8760 hours.  Invalid input(s): CMP  LIVER FUNCTION TESTS: No results for input(s): BILITOT, AST, ALT, ALKPHOS, PROT, ALBUMIN in the last 8760 hours.  TUMOR MARKERS: No results for input(s): AFPTM, CEA, CA199, CHROMGRNA in the last 8760 hours.  Assessment and Plan:  48 year old female with a  history of uterine leiomyoma with a single, very large intramural fibroid measuring 7.8 cm.   Thank you for this interesting consult.  I greatly enjoyed meeting Donna Atkinson and look forward to participating in their care.  A copy of this report was sent to the requesting provider on this date.  Electronically Signed: Warren Dais, AGACNP-BC 10/24/2023, 12:58 PM   I spent a total of  40 Minutes   in face to face in clinical consultation, greater than 50% of which was counseling/coordinating care for symptomatic uterine  fibroids.

## 2023-10-25 ENCOUNTER — Ambulatory Visit
Admission: RE | Admit: 2023-10-25 | Discharge: 2023-10-25 | Disposition: A | Discharge status: Home / Self Care | Payer: Self-pay | Source: Ambulatory Visit | Attending: Obstetrics and Gynecology | Admitting: Obstetrics and Gynecology

## 2023-10-25 VITALS — BP 165/88 | HR 97 | Temp 98.6°F | Resp 14 | Wt 240.0 lb

## 2023-10-25 DIAGNOSIS — I10 Essential (primary) hypertension: Secondary | ICD-10-CM

## 2023-10-25 DIAGNOSIS — Z6841 Body Mass Index (BMI) 40.0 and over, adult: Secondary | ICD-10-CM

## 2023-10-25 DIAGNOSIS — D259 Leiomyoma of uterus, unspecified: Secondary | ICD-10-CM

## 2023-10-25 HISTORY — PX: IR RADIOLOGIST EVAL & MGMT: IMG5224

## 2023-10-25 HISTORY — DX: Morbid (severe) obesity due to excess calories: E66.01

## 2023-10-25 HISTORY — DX: Hyperlipidemia, unspecified: E78.5

## 2023-10-26 ENCOUNTER — Other Ambulatory Visit: Payer: Self-pay | Admitting: Interventional Radiology

## 2023-10-26 DIAGNOSIS — D259 Leiomyoma of uterus, unspecified: Secondary | ICD-10-CM

## 2023-11-12 ENCOUNTER — Telehealth: Payer: Self-pay

## 2023-11-12 MED ORDER — NAPROXEN 500 MG PO TABS: 500.0000 mg | ORAL_TABLET | Freq: Two times a day (BID) | ORAL | 0 refills | Status: AC

## 2023-11-12 MED ORDER — ONDANSETRON HCL 8 MG PO TABS: 8.0000 mg | ORAL_TABLET | Freq: Three times a day (TID) | ORAL | 0 refills | Status: AC | PRN

## 2023-11-12 MED ORDER — PROMETHAZINE HCL 12.5 MG PO TABS: 12.5000 mg | ORAL_TABLET | ORAL | 0 refills | Status: AC | PRN

## 2023-11-12 MED ORDER — DOCUSATE SODIUM 100 MG PO CAPS: 100.0000 mg | ORAL_CAPSULE | Freq: Two times a day (BID) | ORAL | 0 refills | Status: AC

## 2023-11-12 NOTE — Discharge Instructions (Signed)
Uterine Artery Embolization After Care   What can I expect after the procedure?   After the procedure, it is common to have:   Mild pain or discomfort at the arterial entry site   Uterine Cramping   Cramps can vary in strength from what you would consider to be a bad menstrual cycle all the way up to as severe as labor pains.   The cramping is typically the most severe the afternoon and evening the day of the procedure and begin to improve the next day and each day thereafter.   Vaginal bleeding. Your cycle may become irregular the first several months.   Vaginal discharge. We recommend you wear a panty liner for first 4-6 weeks following your procedure.    Nausea and vomiting.      Follow these instructions at home:   Medicines   Take your medicine exactly as told, at the same time every day. This is vital to helping you with a smooth recovery.   Zofran (ondansetron) is used to prevent nausea before it starts.  You will have a prescription to take 8 mg of this medication every 8 hours.  You should take this even if you don't feel nauseated because it is meant to prevent the nausea from occurring.  Once you get nauseated and start to vomit, you may not be able to keep your other medicines down and your pain can be left untreated.  You can take this with every other dose of the oxycodone/acetaminophen   Naproxen is a non-steroidal anti-inflammatory medicine called and is critical in keeping your inflammation and pain under control.  You must take this every 12 hrs. We recommend 8 am and 8 pm.    Percocet (oxycodone/acetaminophen) is a combination narcotic pain medication with Tylenol.  This is to help with your pain.  Take it every 4 hours regardless of your pain level the first 2 days.  Set an alarm to wake up so you don't miss a dose overnight and get behind on your pain control.  After the first 48 hrs, if your pain is minimal you can take only as needed.    Colace (docusate  sodium) is a stool softener to help prevent constipation.  The pain medications often cause constipation which can be particularly uncomfortable after [place].  We recommend you take this at 8 am and 8 pm with your naproxen.    Phenergan (promethazine) is another medication for nausea.  If you still feel sick to your stomach or vomit despite the Zofran (ondansetron) take this medicine every 8 hours as needed.    Incision care   Leave your bandage on for 24 hrs and keep the area dry   You may remove the bandage after 24 hrs and then shower as normal.    Do not submerge in a bath, pool or hot tub until the small incision is completely healed (5-7 days).   Activity   Do not lift anything that is heavier than 5 lb (2.3 kg)for the first 3 days.     Return to your normal activities after day 5.  Take it slowly and listen to your body. Ask your health care provider what activities are safe for you.   General instructions   Many women find a hot water bottle or heating pad helpful when placed on the lower abdomen.  This is fine to do if it helps your cramps.    Do not use any products that contain nicotine or tobacco.   These products include cigarettes, chewing tobacco, and vaping devices, such as e-cigarettes. These can delay incision healing. If you need help quitting, ask your health care provider.   Do not have sex or put anything in your vagina for at least 14 days.    Do not drink alcohol until your health care provider says it is okay.   Keep all follow-up visits. This is important.      Please contact our office at [phone] for the following symptoms:   You have a fever.   You have more redness, swelling, or pain around your incision.   You have more fluid or blood coming from your incision site.   Your incision feels warm to the touch.   You have pus or a bad smell coming from your incision or vagina.   You have a rash.   You have nausea, or you cannot eat or drink  anything without vomiting.   You have a vaginal discharge that is not getting lighter.   Get help right away if:   You have trouble breathing.   You have chest pain.   You have severe pain in your abdomen, and it does not get better with medicine.   You have leg pain or leg swelling.   You feel dizzy, or you faint.   Do not wait to see if the symptoms will go away.   Do not drive yourself to the hospital.      These symptoms may be an emergency.    Get help right away. Call 911.   Summary   After the procedure, it is common to have cramps, or pain or discomfort at the incision site. You will be given pain medicine.   Follow instructions from your health care provider about how to take care of your incision. Check your incision area every day for signs of infection.   Take over-the-counter and prescription medicines only as told by your health care provider.   Contact your health care provider if you have symptoms of infection or other symptoms that do not get better with treatment.   Thanks for visiting [facility] ! 

## 2023-11-13 ENCOUNTER — Ambulatory Visit
Admission: RE | Admit: 2023-11-13 | Discharge: 2023-11-13 | Disposition: A | Discharge status: Home / Self Care | Source: Ambulatory Visit | Attending: Interventional Radiology | Admitting: Interventional Radiology

## 2023-11-13 DIAGNOSIS — D259 Leiomyoma of uterus, unspecified: Secondary | ICD-10-CM

## 2023-11-13 HISTORY — PX: IR EMBO TUMOR ORGAN ISCHEMIA INFARCT INC GUIDE ROADMAPPING: IMG5449

## 2023-11-13 MED ORDER — HYDROMORPHONE HCL 1 MG/ML IJ SOLN
0.5000 mg | Freq: Once | INTRAMUSCULAR | Status: AC
Start: 2023-11-13 — End: 2023-11-13
  Administered 2023-11-13: 1 mg via INTRAVENOUS

## 2023-11-13 MED ORDER — KETOROLAC TROMETHAMINE 30 MG/ML IJ SOLN
30.0000 mg | INTRAMUSCULAR | Status: AC
  Administered 2023-11-13: 30 mg via INTRAVENOUS

## 2023-11-13 MED ORDER — PROMETHAZINE HCL 12.5 MG PO TABS
12.5000 mg | ORAL_TABLET | Freq: Once | ORAL | Status: AC
Start: 2023-11-13 — End: 2023-11-13
  Administered 2023-11-13: 25 mg via ORAL

## 2023-11-13 MED ORDER — FENTANYL CITRATE (PF) 100 MCG/2ML IJ SOLN
INTRAMUSCULAR | Status: AC | PRN
  Administered 2023-11-13 (×2): 50 ug via INTRAVENOUS

## 2023-11-13 MED ORDER — VERAPAMIL HCL 2.5 MG/ML IV SOLN: Freq: Once | INTRA_ARTERIAL | Status: AC

## 2023-11-13 MED ORDER — HYDROMORPHONE HCL 1 MG/ML IJ SOLN
INTRAMUSCULAR | Status: AC | PRN
  Administered 2023-11-13: .5 mg via INTRAVENOUS

## 2023-11-13 MED ORDER — ONDANSETRON HCL 4 MG/2ML IJ SOLN
8.0000 mg | Freq: Once | INTRAMUSCULAR | Status: AC
  Administered 2023-11-13: 8 mg via INTRAVENOUS

## 2023-11-13 MED ORDER — PANTOPRAZOLE SODIUM 40 MG IV SOLR
40.0000 mg | Freq: Once | INTRAVENOUS | Status: AC
  Administered 2023-11-13: 40 mg via INTRAVENOUS

## 2023-11-13 MED ORDER — LIDOCAINE-EPINEPHRINE 1 %-1:100000 IJ SOLN
10.0000 mL | Freq: Once | INTRAMUSCULAR | Status: AC
Start: 2023-11-13 — End: 2023-11-13
  Administered 2023-11-13: 10 mL via INTRADERMAL

## 2023-11-13 MED ORDER — IOPAMIDOL (ISOVUE-300) INJECTION 61%
100.0000 mL | Freq: Once | INTRAVENOUS | Status: AC | PRN
  Administered 2023-11-13: 70 mL via INTRA_ARTERIAL

## 2023-11-13 MED ORDER — DEXAMETHASONE SODIUM PHOSPHATE 10 MG/ML IJ SOLN
10.0000 mg | Freq: Once | INTRAMUSCULAR | Status: AC
  Administered 2023-11-13: 10 mg via INTRAVENOUS

## 2023-11-13 MED ORDER — MIDAZOLAM HCL 2 MG/2ML IJ SOLN
INTRAMUSCULAR | Status: AC | PRN
  Administered 2023-11-13: 2 mg via INTRAVENOUS

## 2023-11-13 MED ORDER — MIDAZOLAM HCL 2 MG/2ML IJ SOLN: 1.0000 mg | INTRAMUSCULAR | Status: DC | PRN

## 2023-11-13 MED ORDER — SODIUM CHLORIDE 0.9 % IV SOLN: INTRAVENOUS | Status: DC

## 2023-11-13 MED ORDER — ACETAMINOPHEN 10 MG/ML IV SOLN
1000.0000 mg | Freq: Once | INTRAVENOUS | Status: AC
  Administered 2023-11-13: 1000 mg via INTRAVENOUS

## 2023-11-13 MED ORDER — FENTANYL CITRATE PF 50 MCG/ML IJ SOSY: 25.0000 ug | PREFILLED_SYRINGE | INTRAMUSCULAR | Status: DC | PRN

## 2023-11-13 MED ORDER — KETOROLAC TROMETHAMINE 30 MG/ML IJ SOLN
30.0000 mg | Freq: Once | INTRAMUSCULAR | Status: AC
  Administered 2023-11-13: 30 mg via INTRAMUSCULAR

## 2023-11-13 MED ORDER — CEFAZOLIN SODIUM-DEXTROSE 2-4 GM/100ML-% IV SOLN
2.0000 g | INTRAVENOUS | Status: AC
  Administered 2023-11-13: 2 g via INTRAVENOUS

## 2023-11-13 NOTE — Progress Notes (Signed)
 Pt back in nursing recovery area. Pt still drowsy from procedure but will wake up when spoken to. Pt follows commands, talks in complete sentences and has no complaints at this time. Pt will remain in nurses station until discharged by Radiologist.

## 2023-11-14 ENCOUNTER — Telehealth: Payer: Self-pay

## 2023-11-20 ENCOUNTER — Telehealth: Payer: Self-pay

## 2023-11-27 ENCOUNTER — Telehealth: Payer: Self-pay

## 2023-11-28 ENCOUNTER — Other Ambulatory Visit: Payer: Self-pay | Admitting: Interventional Radiology

## 2023-11-28 DIAGNOSIS — D259 Leiomyoma of uterus, unspecified: Secondary | ICD-10-CM

## 2023-12-18 ENCOUNTER — Ambulatory Visit
Admission: RE | Admit: 2023-12-18 | Discharge: 2023-12-18 | Disposition: A | Source: Ambulatory Visit | Attending: Interventional Radiology | Admitting: Interventional Radiology

## 2023-12-18 DIAGNOSIS — D259 Leiomyoma of uterus, unspecified: Secondary | ICD-10-CM

## 2023-12-18 HISTORY — PX: IR RADIOLOGIST EVAL & MGMT: IMG5224

## 2023-12-18 NOTE — Progress Notes (Addendum)
 Chief Complaint: Symptomatic uterine fibroids s/p uterine artery embolization on 9.16.25  Referring Physician(s): Dr. Rutherford Gain  History of Present Illness: Donna Atkinson is a 48 y.o. female outpatient. History of HTN, tachycardia, obesity and uterine leiomyoma with menorrhagia s/p bilateral uterine artery embolization on 9.16.25 with IR Attending Dr. Wilkie Lent.   MyChart video conference today.  Past Medical History:  Diagnosis Date   History of pre-eclampsia    Hyperlipidemia    Hypertension    Hypertension    Morbid obesity (HCC)     Past Surgical History:  Procedure Laterality Date   IR EMBO TUMOR ORGAN ISCHEMIA INFARCT INC GUIDE ROADMAPPING  11/13/2023   IR RADIOLOGIST EVAL & MGMT  10/25/2023   IR RADIOLOGIST EVAL & MGMT  12/18/2023    Allergies: Other and Lisinopril  Medications: Prior to Admission medications   Medication Sig Start Date End Date Taking? Authorizing Provider  amLODipine (NORVASC) 10 MG tablet Take 10 mg by mouth daily.    [provider]  carvedilol  (COREG ) 12.5 MG tablet TAKE 1 TABLET BY MOUTH 2 TIMES DAILY. 11/05/19   Lonni Slain, MD  hydrochlorothiazide (HYDRODIURIL) 25 MG tablet Take 25 mg by mouth daily.    [provider]  losartan (COZAAR) 100 MG tablet Take 100 mg by mouth daily.    [provider]  ondansetron  (ZOFRAN ) 8 MG tablet Take 1 tablet (8 mg total) by mouth every 8 (eight) hours as needed for nausea or vomiting. 11/12/23   Lent Wilkie POUR, MD  promethazine  (PHENERGAN ) 12.5 MG tablet Take 1 tablet (12.5 mg total) by mouth every 4 (four) hours as needed for nausea or vomiting. 11/12/23   Lent Wilkie POUR, MD  spironolactone  (ALDACTONE ) 25 MG tablet TAKE 1/2 TABLET BY MOUTH EVERY DAY 12/30/19   Lonni Slain, MD     Family History  Problem Relation Age of Onset   Hypertension Mother    Lupus Mother    Congestive Heart Failure Mother    Atrial fibrillation  Mother    Hypertension Father    Diabetes Father    Stroke Father 63   Hypertension Sister    CVA Maternal Grandfather    Stroke Maternal Grandfather     Social History   Socioeconomic History   Marital status: Married    Spouse name: Not on file   Number of children: 2   Years of education: Not on file   Highest education level: Not on file  Occupational History   Occupation: TEACHER  Tobacco Use   Smoking status: Never   Smokeless tobacco: Never  Substance and Sexual Activity   Alcohol use: No   Drug use: No   Sexual activity: Not on file  Other Topics Concern   Not on file  Social History Narrative   Not on file   Social Drivers of Health   Financial Resource Strain: Not on file  Food Insecurity: Not on file  Transportation Needs: Not on file  Physical Activity: Not on file  Stress: Not on file  Social Connections: Not on file     Review of Systems: A 12 point ROS discussed and pertinent positives are indicated in the HPI above.  All other systems are negative.  Review of Systems  Constitutional:  Negative for fatigue and fever.  HENT:  Negative for congestion.   Respiratory:  Negative for cough and shortness of breath.   Gastrointestinal:  Negative for abdominal pain, diarrhea, nausea and vomiting.    Vital  Signs: There were no vitals taken for this visit.    Physical Exam Vitals and nursing note reviewed.  Constitutional:      Appearance: She is well-developed.  HENT:     Head: Normocephalic and atraumatic.  Eyes:     Conjunctiva/sclera: Conjunctivae normal.  Pulmonary:     Effort: Pulmonary effort is normal.  Musculoskeletal:        General: Normal range of motion.     Cervical back: Normal range of motion.  Neurological:     General: No focal deficit present.     Mental Status: She is alert and oriented to person, place, and time. Mental status is at baseline.  Psychiatric:        Mood and Affect: Mood normal.        Thought Content:  Thought content normal.        Judgment: Judgment normal.     Mallampati Score:     Imaging: IR Radiologist Eval & Mgmt Result Date: 12/18/2023 EXAM: ESTABLISHED PATIENT OFFICE VISIT CHIEF COMPLAINT: SEE NOTE IN EPIC HISTORY OF PRESENT ILLNESS: SEE NOTE IN EPIC REVIEW OF SYSTEMS: SEE NOTE IN EPIC PHYSICAL EXAMINATION: SEE NOTE IN EPIC ASSESSMENT AND PLAN: SEE NOTE IN EPIC Electronically Signed   By: Wilkie Lent M.D.   On: 12/18/2023 08:09    Labs:  CBC: No results for input(s): WBC, HGB, HCT, PLT in the last 8760 hours.  COAGS: No results for input(s): INR, APTT in the last 8760 hours.  BMP: No results for input(s): NA, K, CL, CO2, GLUCOSE, BUN, CALCIUM, CREATININE, GFRNONAA, GFRAA in the last 8760 hours.  Invalid input(s): CMP  LIVER FUNCTION TESTS: No results for input(s): BILITOT, AST, ALT, ALKPHOS, PROT, ALBUMIN in the last 8760 hours.  TUMOR MARKERS: No results for input(s): AFPTM, CEA, CA199, CHROMGRNA in the last 8760 hours.  Assessment and Plan:    Very pleasant 80 -year-old with a history of  uterine leiomyoma with menorrhagia  following bilateral uterine artery embolization performed on 9.16.25.  She is now approximately 1 month status post percutaneous bilateral uterine artery embolization. She has 1 cycle without issues. Reiterated that it could take up to 3 months for her menstrual cycle to return to normal. Right groin access site unremarkable. She has completely recovered from her procedure. We will continue follow up.  PLAN: 1.) follow-up clinic visit in approximately 2 months ( 3 months post procedure)  This encounter was conducted via the [employer] providing interactive audio and visual communication.  The patient provided verbal consent to conduct a virtual appointment.  The patient was located at their primary residence during this encounter.   Thank you for this interesting consult.   I greatly enjoyed meeting AURORE REDINGER and look forward to participating in their care.  A copy of this report was sent to the requesting provider on this date.  Electronically Signed: Delon JAYSON Beagle 12/18/2023, 8:29 AM   I spent a total of    10 Minutes in face to face in clinical consultation, greater than 50% of which was counseling/coordinating care for Follow uterine artery embolization

## 2024-02-04 ENCOUNTER — Other Ambulatory Visit: Payer: Self-pay | Admitting: Interventional Radiology

## 2024-02-04 DIAGNOSIS — D250 Submucous leiomyoma of uterus: Secondary | ICD-10-CM
